# Patient Record
Sex: Female | Born: 1990 | Race: White | Hispanic: No | Marital: Married | Smoking: Never smoker
Health system: Southern US, Community
[De-identification: ages and names within clinical notes are randomized; demographics above are authoritative.]

## PROBLEM LIST (undated history)

## (undated) ENCOUNTER — Inpatient Hospital Stay (HOSPITAL_COMMUNITY): Payer: Self-pay

## (undated) DIAGNOSIS — Z789 Other specified health status: Secondary | ICD-10-CM

## (undated) DIAGNOSIS — R519 Headache, unspecified: Secondary | ICD-10-CM

## (undated) DIAGNOSIS — O139 Gestational [pregnancy-induced] hypertension without significant proteinuria, unspecified trimester: Secondary | ICD-10-CM

## (undated) DIAGNOSIS — R51 Headache: Secondary | ICD-10-CM

## (undated) HISTORY — PX: APPENDECTOMY: SHX54

---
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
## 2011-04-19 ENCOUNTER — Other Ambulatory Visit: Payer: Self-pay | Admitting: Gynecology

## 2011-08-29 ENCOUNTER — Encounter (HOSPITAL_COMMUNITY): Payer: Self-pay | Admitting: Emergency Medicine

## 2011-08-29 ENCOUNTER — Emergency Department (HOSPITAL_COMMUNITY)
Admission: EM | Admit: 2011-08-29 | Discharge: 2011-08-30 | Disposition: A | Discharge status: Home / Self Care | Payer: BC Managed Care – PPO | Attending: Emergency Medicine | Admitting: Emergency Medicine

## 2011-08-29 DIAGNOSIS — R112 Nausea with vomiting, unspecified: Secondary | ICD-10-CM

## 2011-08-29 DIAGNOSIS — O9989 Other specified diseases and conditions complicating pregnancy, childbirth and the puerperium: Secondary | ICD-10-CM | POA: Insufficient documentation

## 2011-08-29 DIAGNOSIS — K529 Noninfective gastroenteritis and colitis, unspecified: Secondary | ICD-10-CM

## 2011-08-29 DIAGNOSIS — K5289 Other specified noninfective gastroenteritis and colitis: Secondary | ICD-10-CM | POA: Insufficient documentation

## 2011-08-29 LAB — URINALYSIS, ROUTINE W REFLEX MICROSCOPIC
Ketones, ur: NEGATIVE mg/dL
Leukocytes, UA: NEGATIVE
Nitrite: NEGATIVE

## 2011-08-29 MED ORDER — ONDANSETRON HCL 4 MG/2ML IJ SOLN
4.0000 mg | Freq: Once | INTRAMUSCULAR | Status: AC
  Administered 2011-08-29: 4 mg via INTRAVENOUS
  Filled 2011-08-29: qty 2

## 2011-08-29 MED ORDER — SODIUM CHLORIDE 0.9 % IV BOLUS (SEPSIS)
2000.0000 mL | Freq: Once | INTRAVENOUS | Status: AC
  Administered 2011-08-29: 2000 mL via INTRAVENOUS

## 2011-08-29 MED ORDER — ONDANSETRON 4 MG PO TBDP: ORAL_TABLET | ORAL | Status: AC

## 2011-08-29 NOTE — ED Provider Notes (Signed)
Two days of multiple nonbloody vomiting and diarrhea spells with diffuse abdominal pain and cramps no vaginal bleeding or discharge with abdomen minimally tender diffusely without peritonitis.  I saw and evaluated the patient, reviewed the resident's note and I agree with the findings and plan.  Hurman Horn, MD 08/30/11 2237

## 2011-08-29 NOTE — Discharge Instructions (Signed)
Diet for Diarrhea, Adult Having frequent, runny stools (diarrhea) has many causes. Diarrhea may be caused or worsened by food or drink. Diarrhea may be relieved by changing your diet. IF YOU ARE NOT TOLERATING SOLID FOODS:  Drink enough water and fluids to keep your urine clear or pale yellow.   Avoid sugary drinks and sodas as well as milk-based beverages.   Avoid beverages containing caffeine and alcohol.   You may try rehydrating beverages. You can make your own by following this recipe:    tsp table salt.    tsp baking soda.   ? tsp salt substitute (potassium chloride).   1 tbs + 1 tsp sugar.   1 qt water.  As your stools become more solid, you can start eating solid foods. Add foods one at a time. If a certain food causes your diarrhea to get worse, avoid that food and try other foods. A low fiber, low-fat, and lactose-free diet is recommended. Small, frequent meals may be better tolerated.  Starches  Allowed:  White, French, and pita breads, plain rolls, buns, bagels. Plain muffins, matzo. Soda, saltine, or graham crackers. Pretzels, melba toast, zwieback. Cooked cereals made with water: cornmeal, farina, cream cereals. Dry cereals: refined corn, wheat, rice. Potatoes prepared any way without skins, refined macaroni, spaghetti, noodles, refined rice.   Avoid:  Bread, rolls, or crackers made with whole wheat, multi-grains, rye, bran seeds, nuts, or coconut. Corn tortillas or taco shells. Cereals containing whole grains, multi-grains, bran, coconut, nuts, or raisins. Cooked or dry oatmeal. Coarse wheat cereals, granola. Cereals advertised as "high-fiber." Potato skins. Whole grain pasta, wild or brown rice. Popcorn. Sweet potatoes/yams. Sweet rolls, doughnuts, waffles, pancakes, sweet breads.  Vegetables  Allowed: Strained tomato and vegetable juices. Most well-cooked and canned vegetables without seeds. Fresh: Tender lettuce, cucumber without the skin, cabbage, spinach, bean  sprouts.   Avoid: Fresh, cooked, or canned: Artichokes, baked beans, beet greens, broccoli, Brussels sprouts, corn, kale, legumes, peas, sweet potatoes. Cooked: Green or red cabbage, spinach. Avoid large servings of any vegetables, because vegetables shrink when cooked, and they contain more fiber per serving than fresh vegetables.  Fruit  Allowed: All fruit juices except prune juice. Cooked or canned: Apricots, applesauce, cantaloupe, cherries, fruit cocktail, grapefruit, grapes, kiwi, mandarin oranges, peaches, pears, plums, watermelon. Fresh: Apples without skin, ripe banana, grapes, cantaloupe, cherries, grapefruit, peaches, oranges, plums. Keep servings limited to  cup or 1 piece.   Avoid: Fresh: Apple with skin, apricots, mango, pears, raspberries, strawberries. Prune juice, stewed or dried prunes. Dried fruits, raisins, dates. Large servings of all fresh fruits.  Meat and Meat Substitutes  Allowed: Ground or well-cooked tender beef, ham, veal, lamb, pork, or poultry. Eggs, plain cheese. Fish, oysters, shrimp, lobster, other seafoods. Liver, organ meats.   Avoid: Tough, fibrous meats with gristle. Peanut butter, smooth or chunky. Cheese, nuts, seeds, legumes, dried peas, beans, lentils.  Milk  Allowed: Yogurt, lactose-free milk, kefir, drinkable yogurt, buttermilk, soy milk.   Avoid: Milk, chocolate milk, beverages made with milk, such as milk shakes.  Soups  Allowed: Bouillon, broth, or soups made from allowed foods. Any strained soup.   Avoid: Soups made from vegetables that are not allowed, cream or milk-based soups.  Desserts and Sweets  Allowed: Sugar-free gelatin, sugar-free frozen ice pops made without sugar alcohol.   Avoid: Plain cakes and cookies, pie made with allowed fruit, pudding, custard, cream pie. Gelatin, fruit, ice, sherbet, frozen ice pops. Ice cream, ice milk without nuts. Plain hard candy,   honey, jelly, molasses, syrup, sugar, chocolate syrup, gumdrops,  marshmallows.  Fats and Oils  Allowed: Avoid any fats and oils.   Avoid: Seeds, nuts, olives, avocados. Margarine, butter, cream, mayonnaise, salad oils, plain salad dressings made from allowed foods. Plain gravy, crisp bacon without rind.  Beverages  Allowed: Water, decaffeinated teas, oral rehydration solutions, sugar-free beverages.   Avoid: Fruit juices, caffeinated beverages (coffee, tea, soda or pop), alcohol, sports drinks, or lemon-lime soda or pop.  Condiments  Allowed: Ketchup, mustard, horseradish, vinegar, cream sauce, cheese sauce, cocoa powder. Spices in moderation: allspice, basil, bay leaves, celery powder or leaves, cinnamon, cumin powder, curry powder, ginger, mace, marjoram, onion or garlic powder, oregano, paprika, parsley flakes, ground pepper, rosemary, sage, savory, tarragon, thyme, turmeric.   Avoid: Coconut, honey.  Weight Monitoring: Weigh yourself every day. You should weigh yourself in the morning after you urinate and before you eat breakfast. Wear the same amount of clothing when you weigh yourself. Record your weight daily. Bring your recorded weights to your clinic visits. Tell your caregiver right away if you have gained 3 lb/1.4 kg or more in 1 day, 5 lb/2.3 kg in a week, or whatever amount you were told to report. SEEK IMMEDIATE MEDICAL CARE IF:   You are unable to keep fluids down.   You start to throw up (vomit) or diarrhea keeps coming back (persistent).   Abdominal pain develops, increases, or can be felt in one place (localizes).   You have an oral temperature above 102 F (38.9 C), not controlled by medicine.   Diarrhea contains blood or mucus.   You develop excessive weakness, dizziness, fainting, or extreme thirst.  MAKE SURE YOU:   Understand these instructions.   Will watch your condition.   Will get help right away if you are not doing well or get worse.  Document Released: 07/31/2003 Document Revised: 04/29/2011 Document Reviewed:  11/21/2008 [facility] Patient Information 2012 [place], [place].  Return for any new or worsening symptoms or any other concerns.

## 2011-08-29 NOTE — ED Provider Notes (Signed)
History     CSN: [id number]  Arrival date & time 08/29/11  2006   None     Chief Complaint  Patient presents with  . Emesis    (Consider location/radiation/quality/duration/timing/severity/associated sxs/prior treatment) HPI Cc n/v/d onset 2 days ago, associate dwith diffuse abd cramping, nonradiating, epigastrium mostly.  Sx's mild to moderate.  nonbloody emesis and stools.  No fever. History reviewed. No pertinent past medical history.  Past Surgical History  Procedure Date  . Appendectomy     No family history on file.  History  Substance Use Topics  . Smoking status: Never Smoker   . Smokeless tobacco: Not on file  . Alcohol Use: No    OB History    Grav Para Term Preterm Abortions TAB SAB Ect Mult Living   1               Review of Systems  Constitutional: Negative for fever and chills.  Gastrointestinal: Positive for nausea, vomiting, abdominal pain and diarrhea.  Genitourinary: Negative for dysuria, vaginal bleeding and vaginal discharge.  All other systems reviewed and are negative.    Allergies  Review of patient's allergies indicates no known allergies.  Home Medications   Current Outpatient Rx  Name Route Sig Dispense Refill  . ONDANSETRON 4 MG PO TBDP  4mg  ODT q4 hours prn nausea/vomit 15 tablet 0    BP 120/87  Pulse 78  Temp(Src) 98.1 F (36.7 C) (Oral)  Resp 18  SpO2 100%  LMP 07/10/2011  Physical Exam  Nursing note and vitals reviewed. Constitutional: She appears well-developed and well-nourished.  HENT:  Head: Normocephalic and atraumatic.  Eyes: Right eye exhibits no discharge. Left eye exhibits no discharge.  Neck: Normal range of motion. Neck supple.  Cardiovascular: Normal rate, regular rhythm and normal heart sounds.   Pulmonary/Chest: Effort normal and breath sounds normal.  Abdominal: Soft. She exhibits no distension and no mass. There is tenderness (mild diffuse, more in epigastrium). There is no rebound and no guarding.   Musculoskeletal: She exhibits no edema and no tenderness.  Neurological: She is alert. GCS eye subscore is 4. GCS verbal subscore is 5. GCS motor subscore is 6.  Skin: Skin is warm and dry.  Psychiatric: She has a normal mood and affect. Her behavior is normal.    ED Course  Procedures (including critical care time)   Labs Reviewed  URINALYSIS, ROUTINE W REFLEX MICROSCOPIC   No results found.   1. Gastroenteritis   2. Nausea vomiting and diarrhea       MDM  Pt is in nad, afebrile, pt slightly tachy, nontoxic appearing, exam and hx as above.  Pt here with n/v/d for 2 days and diffuse abd cramping c/w gastroenteritis.  Bedside US shows gestational sac with fluttering of fetal heart, IUP.  Pt has had no pain localized in the pelvis, no vag bleeding, doubt ectopic.  Checking UA, giving ivf's, zofran.  UA nl, pt feels better, will d/c with zofran, return warnings given      Elijio Miles, MD 08/29/11 2319

## 2011-08-29 NOTE — ED Notes (Signed)
C/o vomiting for 2 days.  Reports abd pain x 3 hours.  States she was incontinent of diarrhea x 2 within the last 3 hours.  Approx 6-[redacted] weeks pregnant.

## 2011-09-20 LAB — OB RESULTS CONSOLE GC/CHLAMYDIA: Chlamydia: NEGATIVE

## 2012-03-06 ENCOUNTER — Inpatient Hospital Stay (HOSPITAL_COMMUNITY)
Admission: AD | Admit: 2012-03-06 | Discharge: 2012-03-06 | Disposition: A | Discharge status: Home / Self Care | Payer: BC Managed Care – PPO | Source: Ambulatory Visit | Attending: Obstetrics and Gynecology | Admitting: Obstetrics and Gynecology

## 2012-03-06 ENCOUNTER — Encounter (HOSPITAL_COMMUNITY): Payer: Self-pay | Admitting: *Deleted

## 2012-03-06 DIAGNOSIS — O99891 Other specified diseases and conditions complicating pregnancy: Secondary | ICD-10-CM | POA: Insufficient documentation

## 2012-03-06 DIAGNOSIS — O139 Gestational [pregnancy-induced] hypertension without significant proteinuria, unspecified trimester: Secondary | ICD-10-CM

## 2012-03-06 DIAGNOSIS — R51 Headache: Secondary | ICD-10-CM | POA: Insufficient documentation

## 2012-03-06 DIAGNOSIS — R030 Elevated blood-pressure reading, without diagnosis of hypertension: Secondary | ICD-10-CM | POA: Insufficient documentation

## 2012-03-06 HISTORY — DX: Other specified health status: Z78.9

## 2012-03-06 LAB — URINALYSIS, ROUTINE W REFLEX MICROSCOPIC
Bilirubin Urine: NEGATIVE
Glucose, UA: NEGATIVE mg/dL
Ketones, ur: NEGATIVE mg/dL
Nitrite: NEGATIVE
pH: 6.5 (ref 5.0–8.0)

## 2012-03-06 LAB — COMPREHENSIVE METABOLIC PANEL
AST: 9 U/L (ref 0–37)
CO2: 24 mEq/L (ref 19–32)
Chloride: 102 mEq/L (ref 96–112)
Creatinine, Ser: 0.55 mg/dL (ref 0.50–1.10)
GFR calc non Af Amer: >90 mL/min (ref >90)
Total Bilirubin: 0.1 mg/dL — ABNORMAL LOW (ref 0.3–1.2)

## 2012-03-06 LAB — CBC
HCT: 36.4 % (ref 36.0–46.0)
Hemoglobin: 12 g/dL (ref 12.0–15.0)
MCV: 86.3 fL (ref 78.0–100.0)
Platelets: 250 10*3/uL (ref 150–400)
RBC: 4.22 MIL/uL (ref 3.87–5.11)
WBC: 12.8 10*3/uL — ABNORMAL HIGH (ref 4.0–10.5)

## 2012-03-06 LAB — LACTATE DEHYDROGENASE: LDH: 148 U/L (ref 94–250)

## 2012-03-06 LAB — URINE MICROSCOPIC-ADD ON

## 2012-03-06 NOTE — MAU Note (Signed)
Sent from MD office for [name] eval, has HA, denies blurry vision, swelling in feet & hands.

## 2012-03-06 NOTE — MAU Note (Signed)
Pt states she has been having scant amount of clear fluid discharge x 1 week.

## 2012-03-06 NOTE — Progress Notes (Signed)
Pt states she always thinks she has wet herself

## 2012-03-06 NOTE — MAU Provider Note (Signed)
History     CSN: [id number]  Arrival date and time: 03/06/12 1519   None     Chief Complaint  Patient presents with  . Hypertension   HPI 21 y.o. G1P0 at [redacted]w[redacted]d sent from office for PIH eval. Denies headache, vision changes, abd pain. + fetal movement.    Past Medical History  Diagnosis Date  . No pertinent past medical history     Past Surgical History  Procedure Date  . Appendectomy     Family History  Problem Relation Age of Onset  . Diabetes Mother   . Hypertension Mother   . Hypertension Father   . Heart disease Father   . Diabetes Maternal Grandmother   . Diabetes Maternal Grandfather   . Hypertension Paternal Grandfather     History  Substance Use Topics  . Smoking status: Never Smoker   . Smokeless tobacco: Not on file  . Alcohol Use: No    Allergies: No Known Allergies  No prescriptions prior to admission    Review of Systems  Constitutional: Negative.   Respiratory: Negative.   Cardiovascular: Negative.   Gastrointestinal: Negative for nausea, vomiting, abdominal pain, diarrhea and constipation.  Genitourinary: Negative for dysuria, urgency, frequency, hematuria and flank pain.       Negative for vaginal bleeding, cramping/contractions  Musculoskeletal: Negative.   Neurological: Negative.   Psychiatric/Behavioral: Negative.    Physical Exam   Blood pressure 128/92, pulse 115, temperature 98.1 F (36.7 C), temperature source Oral, resp. rate 18, height 5' (1.524 m), weight 198 lb 6.4 oz (89.994 kg), last menstrual period 07/10/2011, SpO2 100.00%.  Filed Vitals:   03/06/12 1600 03/06/12 1615 03/06/12 1630 03/06/12 1700  BP: 128/92 136/87 129/69 130/88  Pulse: 115 103 113 108  Temp:      TempSrc:      Resp:      Height:      Weight:      SpO2:         Physical Exam  Nursing note and vitals reviewed. Constitutional: She is oriented to person, place, and time. She appears well-developed and well-nourished. No distress.    Cardiovascular: Normal rate.   Respiratory: Effort normal.  GI: Soft. There is no tenderness.  Musculoskeletal: Normal range of motion.  Neurological: She is alert and oriented to person, place, and time. She has normal reflexes.  Skin: Skin is warm and dry.  Psychiatric: She has a normal mood and affect.   EFM reactive MAU Course  Procedures Results for orders placed during the hospital encounter of 03/06/12 (from the past 72 hour(s))  CBC     Status: Abnormal   Collection Time   03/06/12  3:45 PM      Component Value Range Comment   WBC 12.8 (*) 4.0 - 10.5 K/uL    RBC 4.22  3.87 - 5.11 MIL/uL    Hemoglobin 12.0  12.0 - 15.0 g/dL    HCT 11.9  14.7 - 82.9 %    MCV 86.3  78.0 - 100.0 fL    MCH 28.4  26.0 - 34.0 pg    MCHC 33.0  30.0 - 36.0 g/dL    RDW 56.2  13.0 - 86.5 %    Platelets 250  150 - 400 K/uL   COMPREHENSIVE METABOLIC PANEL     Status: Abnormal   Collection Time   03/06/12  3:45 PM      Component Value Range Comment   Sodium 137  135 - 145 mEq/L  Potassium 3.8  3.5 - 5.1 mEq/L    Chloride 102  96 - 112 mEq/L    CO2 24  19 - 32 mEq/L    Glucose, Bld 93  70 - 99 mg/dL    BUN 7  6 - 23 mg/dL    Creatinine, Ser 2.95  0.50 - 1.10 mg/dL    Calcium 9.1  8.4 - 62.1 mg/dL    Total Protein 6.3  6.0 - 8.3 g/dL    Albumin 2.6 (*) 3.5 - 5.2 g/dL    AST 9  0 - 37 U/L    ALT 8  0 - 35 U/L    Alkaline Phosphatase 110  39 - 117 U/L    Total Bilirubin 0.1 (*) 0.3 - 1.2 mg/dL    GFR calc non Af Amer >90  >90 mL/min    GFR calc Af Amer >90  >90 mL/min   URIC ACID     Status: Normal   Collection Time   03/06/12  3:45 PM      Component Value Range Comment   Uric Acid, Serum 4.8  2.4 - 7.0 mg/dL   LACTATE DEHYDROGENASE     Status: Normal   Collection Time   03/06/12  3:45 PM      Component Value Range Comment   LDH 148  94 - 250 U/L   URINALYSIS, ROUTINE W REFLEX MICROSCOPIC     Status: Abnormal   Collection Time   03/06/12  4:09 PM      Component Value Range Comment    Color, Urine YELLOW  YELLOW    APPearance HAZY (*) CLEAR    Specific Gravity, Urine >1.030 (*) 1.005 - 1.030    pH 6.5  5.0 - 8.0    Glucose, UA NEGATIVE  NEGATIVE mg/dL    Hgb urine dipstick NEGATIVE  NEGATIVE    Bilirubin Urine NEGATIVE  NEGATIVE    Ketones, ur NEGATIVE  NEGATIVE mg/dL    Protein, ur NEGATIVE  NEGATIVE mg/dL    Urobilinogen, UA 0.2  0.0 - 1.0 mg/dL    Nitrite NEGATIVE  NEGATIVE    Leukocytes, UA TRACE (*) NEGATIVE   URINE MICROSCOPIC-ADD ON     Status: Abnormal   Collection Time   03/06/12  4:09 PM      Component Value Range Comment   Squamous Epithelial / LPF MANY (*) RARE    WBC, UA 7-10  <3 WBC/hpf    Bacteria, UA MANY (*) RARE      Assessment and Plan   1. PIH (pregnancy induced hypertension)       Medication List     As of 03/08/2012  3:41 PM    CONTINUE taking these medications         acetaminophen 325 MG tablet   Commonly known as: TYLENOL      prenatal multivitamin Tabs            Follow-up Information    Follow up with TOMBLIN II,JAMES E, MD. Schedule an appointment as soon as possible for a visit in 2 days. (for blood pressure check)    Contact information:   [address] [place] [address] [phone]           [name] 03/06/2012, 4:26 PM

## 2012-03-06 NOTE — MAU Note (Signed)
Pt state she was seen in the office today they sent her over for further evaluation

## 2012-03-17 ENCOUNTER — Encounter (HOSPITAL_COMMUNITY): Payer: Self-pay

## 2012-03-17 ENCOUNTER — Inpatient Hospital Stay (HOSPITAL_COMMUNITY)
Admission: AD | Admit: 2012-03-17 | Discharge: 2012-03-17 | Disposition: A | Discharge status: Home / Self Care | Payer: BC Managed Care – PPO | Source: Ambulatory Visit | Attending: Obstetrics and Gynecology | Admitting: Obstetrics and Gynecology

## 2012-03-17 ENCOUNTER — Inpatient Hospital Stay (HOSPITAL_COMMUNITY): Payer: BC Managed Care – PPO

## 2012-03-17 DIAGNOSIS — O99891 Other specified diseases and conditions complicating pregnancy: Secondary | ICD-10-CM | POA: Insufficient documentation

## 2012-03-17 DIAGNOSIS — R51 Headache: Secondary | ICD-10-CM | POA: Insufficient documentation

## 2012-03-17 DIAGNOSIS — R030 Elevated blood-pressure reading, without diagnosis of hypertension: Secondary | ICD-10-CM | POA: Insufficient documentation

## 2012-03-17 LAB — COMPREHENSIVE METABOLIC PANEL
Alkaline Phosphatase: 120 U/L — ABNORMAL HIGH (ref 39–117)
BUN: 9 mg/dL (ref 6–23)
CO2: 25 mEq/L (ref 19–32)
Chloride: 101 mEq/L (ref 96–112)
GFR calc Af Amer: >90 mL/min (ref >90)
Glucose, Bld: 72 mg/dL (ref 70–99)
Potassium: 4.5 mEq/L (ref 3.5–5.1)
Total Bilirubin: 0.2 mg/dL — ABNORMAL LOW (ref 0.3–1.2)

## 2012-03-17 LAB — CBC
HCT: 38.6 % (ref 36.0–46.0)
Hemoglobin: 12.7 g/dL (ref 12.0–15.0)
RBC: 4.43 MIL/uL (ref 3.87–5.11)
WBC: 12.9 10*3/uL — ABNORMAL HIGH (ref 4.0–10.5)

## 2012-03-17 LAB — LACTATE DEHYDROGENASE: LDH: 156 U/L (ref 94–250)

## 2012-03-17 NOTE — MAU Note (Signed)
Patient states she was seen in the office this am and sent to MAU for evaluation of elevated blood pressure and non reactive NST. Patient states she has a slight headache that started when she was informed to come to MAU. Denies any contractions, bleeding or leaking and reports less fetal movement than usual for about one week.

## 2012-03-17 NOTE — Progress Notes (Signed)
Lab at bedside for blood draw.

## 2012-03-17 NOTE — Progress Notes (Signed)
To US for BPP via WC.

## 2012-03-20 ENCOUNTER — Inpatient Hospital Stay (HOSPITAL_COMMUNITY)
Admission: AD | Admit: 2012-03-20 | Discharge: 2012-03-20 | Disposition: A | Discharge status: Home / Self Care | Payer: BC Managed Care – PPO | Source: Ambulatory Visit | Attending: Obstetrics and Gynecology | Admitting: Obstetrics and Gynecology

## 2012-03-20 ENCOUNTER — Encounter (HOSPITAL_COMMUNITY): Payer: Self-pay | Admitting: *Deleted

## 2012-03-20 DIAGNOSIS — O99891 Other specified diseases and conditions complicating pregnancy: Secondary | ICD-10-CM | POA: Insufficient documentation

## 2012-03-20 DIAGNOSIS — O139 Gestational [pregnancy-induced] hypertension without significant proteinuria, unspecified trimester: Secondary | ICD-10-CM

## 2012-03-20 DIAGNOSIS — R030 Elevated blood-pressure reading, without diagnosis of hypertension: Secondary | ICD-10-CM | POA: Insufficient documentation

## 2012-03-20 LAB — COMPREHENSIVE METABOLIC PANEL
ALT: 8 U/L (ref 0–35)
CO2: 25 mEq/L (ref 19–32)
Calcium: 9.3 mg/dL (ref 8.4–10.5)
Chloride: 103 mEq/L (ref 96–112)
Creatinine, Ser: 0.64 mg/dL (ref 0.50–1.10)
GFR calc Af Amer: >90 mL/min (ref >90)
GFR calc non Af Amer: >90 mL/min (ref >90)
Glucose, Bld: 80 mg/dL (ref 70–99)
Sodium: 137 mEq/L (ref 135–145)
Total Bilirubin: 0.1 mg/dL — ABNORMAL LOW (ref 0.3–1.2)

## 2012-03-20 LAB — CBC
Hemoglobin: 11.8 g/dL — ABNORMAL LOW (ref 12.0–15.0)
MCH: 28.6 pg (ref 26.0–34.0)
MCHC: 33 g/dL (ref 30.0–36.0)
Platelets: 221 10*3/uL (ref 150–400)
RDW: 14 % (ref 11.5–15.5)

## 2012-03-20 LAB — URINALYSIS, DIPSTICK ONLY
Bilirubin Urine: NEGATIVE
Ketones, ur: NEGATIVE mg/dL
Leukocytes, UA: NEGATIVE
Nitrite: NEGATIVE
Protein, ur: NEGATIVE mg/dL

## 2012-03-20 NOTE — MAU Note (Signed)
Patient states she was sent from the office for evaluation of elevated blood pressure. Was seen on 10-25 in MAU. States she has been having a headache. Denies contractions, bleeding or leaking and reports good fetal movement.

## 2012-03-20 NOTE — MAU Provider Note (Signed)
History     CSN: [id number]  Arrival date and time: 03/20/12 1621   None     Chief Complaint  Patient presents with  . Hypertension  . Headache   HPI Pt is a 21 y.o. G1P0 at [redacted]w[redacted]d with high blood pressure. Sent over from office today with BP 162/108. Has been high for last 2 weeks. Headache last 2 days. No vision change, no RUQ pain. No ctx, no VB, no lof. Baby moving well. Has not done 24 hour urine protein. Does not know if she had protein in her urine today.  Review of prenatal record shows BP of 146/100 on 10/14 but negative proteinuria. No other records available.   OB History    Grav Para Term Preterm Abortions TAB SAB Ect Mult Living   1               Past Medical History  Diagnosis Date  . No pertinent past medical history     Past Surgical History  Procedure Date  . Appendectomy     Family History  Problem Relation Age of Onset  . Diabetes Mother   . Hypertension Mother   . Hypertension Father   . Heart disease Father   . Diabetes Maternal Grandmother   . Diabetes Maternal Grandfather   . Hypertension Paternal Grandfather     History  Substance Use Topics  . Smoking status: Never Smoker   . Smokeless tobacco: Not on file  . Alcohol Use: No    Allergies: No Known Allergies  Prescriptions prior to admission  Medication Sig Dispense Refill  . Prenatal Vit-Fe Fumarate-FA (PRENATAL MULTIVITAMIN) TABS Take 1 tablet by mouth daily.        Review of Systems  Constitutional: Negative for fever and chills.  HENT: Negative for tinnitus.   Eyes: Negative for blurred vision and double vision.  Respiratory: Negative for cough and shortness of breath.   Cardiovascular: Negative for chest pain and palpitations.  Gastrointestinal: Negative for nausea, vomiting and abdominal pain.  Genitourinary: Negative for dysuria, urgency and frequency.  Musculoskeletal: Negative for back pain.  Neurological: Positive for headaches. Negative for dizziness.    Physical Exam   Blood pressure 148/98, pulse 89, temperature 99.5 F (37.5 C), temperature source Oral, resp. rate 16, weight 91.354 kg (201 lb 6.4 oz), last menstrual period 07/10/2011, SpO2 100.00%.  Filed Vitals:   03/20/12 1648 03/20/12 1704  BP: 140/92 148/98  Pulse: 89   Temp: 99.5 F (37.5 C)   TempSrc: Oral   Resp: 16   Weight: 91.354 kg (201 lb 6.4 oz)   SpO2: 100%     Physical Exam  Constitutional: She is oriented to person, place, and time. She appears well-developed and well-nourished. No distress.  HENT:  Head: Normocephalic and atraumatic.  Eyes: Conjunctivae normal and EOM are normal.  Neck: Normal range of motion. Neck supple.  Cardiovascular: Normal rate, regular rhythm and normal heart sounds.   Respiratory: Effort normal and breath sounds normal. No respiratory distress. She has no wheezes.  GI: There is no tenderness. There is no rebound and no guarding.  Musculoskeletal: Normal range of motion. She exhibits edema. She exhibits no tenderness.  Neurological: She is alert and oriented to person, place, and time.  Skin: Skin is warm and dry.  Psychiatric: She has a normal mood and affect.   FHTs:  135, mod variability, accels present, no decels Toco:  Irritability  Results for orders placed during the hospital encounter of 03/20/12 (from  the past 24 hour(s))  URINALYSIS, DIPSTICK ONLY     Status: Normal   Collection Time   03/20/12  4:55 PM      Component Value Range   Specific Gravity, Urine 1.025  1.005 - 1.030   pH 7.0  5.0 - 8.0   Glucose, UA NEGATIVE  NEGATIVE mg/dL   Hgb urine dipstick NEGATIVE  NEGATIVE   Bilirubin Urine NEGATIVE  NEGATIVE   Ketones, ur NEGATIVE  NEGATIVE mg/dL   Protein, ur NEGATIVE  NEGATIVE mg/dL   Urobilinogen, UA 0.2  0.0 - 1.0 mg/dL   Nitrite NEGATIVE  NEGATIVE   Leukocytes, UA NEGATIVE  NEGATIVE  COMPREHENSIVE METABOLIC PANEL     Status: Abnormal   Collection Time   03/20/12  5:15 PM      Component Value Range    Sodium 137  135 - 145 mEq/L   Potassium 4.1  3.5 - 5.1 mEq/L   Chloride 103  96 - 112 mEq/L   CO2 25  19 - 32 mEq/L   Glucose, Bld 80  70 - 99 mg/dL   BUN 9  6 - 23 mg/dL   Creatinine, Ser 1.61  0.50 - 1.10 mg/dL   Calcium 9.3  8.4 - 09.6 mg/dL   Total Protein 5.8 (*) 6.0 - 8.3 g/dL   Albumin 2.5 (*) 3.5 - 5.2 g/dL   AST 9  0 - 37 U/L   ALT 8  0 - 35 U/L   Alkaline Phosphatase 119 (*) 39 - 117 U/L   Total Bilirubin 0.1 (*) 0.3 - 1.2 mg/dL   GFR calc non Af Amer >90  >90 mL/min   GFR calc Af Amer >90  >90 mL/min  CBC     Status: Abnormal   Collection Time   03/20/12  5:15 PM      Component Value Range   WBC 11.5 (*) 4.0 - 10.5 K/uL   RBC 4.13  3.87 - 5.11 MIL/uL   Hemoglobin 11.8 (*) 12.0 - 15.0 g/dL   HCT 04.5 (*) 40.9 - 81.1 %   MCV 86.7  78.0 - 100.0 fL   MCH 28.6  26.0 - 34.0 pg   MCHC 33.0  30.0 - 36.0 g/dL   RDW 91.4  78.2 - 95.6 %   Platelets 221  150 - 400 K/uL     MAU Course  Procedures   Assessment and Plan  21 y.o. G1P0 at [redacted]w[redacted]d with elevated BP in pregnancy - No proteinuria - Labs normal - Discussed with Dr. Arelia Sneddon. F/U at office tomorrow for BP and to set up induction.  Napoleon Form, MD   Napoleon Form 03/20/2012, 5:08 PM

## 2012-03-24 ENCOUNTER — Encounter (HOSPITAL_COMMUNITY): Payer: Self-pay | Admitting: *Deleted

## 2012-03-24 ENCOUNTER — Inpatient Hospital Stay (HOSPITAL_COMMUNITY)
Admission: AD | Admit: 2012-03-24 | Discharge: 2012-03-28 | DRG: 651 | Disposition: A | Discharge status: Home / Self Care | Payer: BC Managed Care – PPO | Source: Ambulatory Visit | Attending: Obstetrics and Gynecology | Admitting: Obstetrics and Gynecology

## 2012-03-24 DIAGNOSIS — O4590 Premature separation of placenta, unspecified, unspecified trimester: Secondary | ICD-10-CM | POA: Diagnosis present

## 2012-03-24 DIAGNOSIS — O1414 Severe pre-eclampsia complicating childbirth: Principal | ICD-10-CM | POA: Diagnosis present

## 2012-03-24 LAB — COMPREHENSIVE METABOLIC PANEL
ALT: 8 U/L (ref 0–35)
AST: 10 U/L (ref 0–37)
Albumin: 2.4 g/dL — ABNORMAL LOW (ref 3.5–5.2)
Alkaline Phosphatase: 118 U/L — ABNORMAL HIGH (ref 39–117)
CO2: 24 mEq/L (ref 19–32)
Chloride: 102 mEq/L (ref 96–112)
Creatinine, Ser: 0.67 mg/dL (ref 0.50–1.10)
GFR calc non Af Amer: >90 mL/min (ref >90)
Potassium: 3.8 mEq/L (ref 3.5–5.1)
Sodium: 136 mEq/L (ref 135–145)
Total Bilirubin: 0.1 mg/dL — ABNORMAL LOW (ref 0.3–1.2)

## 2012-03-24 LAB — CBC
Hemoglobin: 11.9 g/dL — ABNORMAL LOW (ref 12.0–15.0)
MCHC: 33.6 g/dL (ref 30.0–36.0)
Platelets: 214 10*3/uL (ref 150–400)
RDW: 14.1 % (ref 11.5–15.5)

## 2012-03-24 LAB — TYPE AND SCREEN
ABO/RH(D): O POS
Antibody Screen: NEGATIVE

## 2012-03-24 LAB — OB RESULTS CONSOLE HEPATITIS B SURFACE ANTIGEN: Hepatitis B Surface Ag: NEGATIVE

## 2012-03-24 LAB — OB RESULTS CONSOLE GBS: GBS: NEGATIVE

## 2012-03-24 LAB — OB RESULTS CONSOLE RPR: RPR: NONREACTIVE

## 2012-03-24 MED ORDER — LACTATED RINGERS IV SOLN: 500.0000 mL | Freq: Once | INTRAVENOUS | Status: DC

## 2012-03-24 MED ORDER — FENTANYL 2.5 MCG/ML BUPIVACAINE 1/10 % EPIDURAL INFUSION (WH - ANES): 14.0000 mL/h | INTRAMUSCULAR | Status: DC

## 2012-03-24 MED ORDER — LIDOCAINE HCL (PF) 1 % IJ SOLN: 30.0000 mL | INTRAMUSCULAR | Status: DC | PRN

## 2012-03-24 MED ORDER — OXYTOCIN BOLUS FROM INFUSION: 500.0000 mL | INTRAVENOUS | Status: DC

## 2012-03-24 MED ORDER — ACETAMINOPHEN 325 MG PO TABS: 650.0000 mg | ORAL_TABLET | ORAL | Status: DC | PRN

## 2012-03-24 MED ORDER — PHENYLEPHRINE 40 MCG/ML (10ML) SYRINGE FOR IV PUSH (FOR BLOOD PRESSURE SUPPORT): 80.0000 ug | PREFILLED_SYRINGE | INTRAVENOUS | Status: DC | PRN

## 2012-03-24 MED ORDER — EPHEDRINE 5 MG/ML INJ: 10.0000 mg | INTRAVENOUS | Status: DC | PRN

## 2012-03-24 MED ORDER — ZOLPIDEM TARTRATE 5 MG PO TABS: 5.0000 mg | ORAL_TABLET | Freq: Every evening | ORAL | Status: DC | PRN

## 2012-03-24 MED ORDER — BUTORPHANOL TARTRATE 1 MG/ML IJ SOLN: 1.0000 mg | INTRAMUSCULAR | Status: DC | PRN

## 2012-03-24 MED ORDER — TERBUTALINE SULFATE 1 MG/ML IJ SOLN: 0.2500 mg | Freq: Once | INTRAMUSCULAR | Status: AC | PRN

## 2012-03-24 MED ORDER — CITRIC ACID-SODIUM CITRATE 334-500 MG/5ML PO SOLN
30.0000 mL | ORAL | Status: DC | PRN
  Administered 2012-03-25: 30 mL via ORAL
  Filled 2012-03-24: qty 15

## 2012-03-24 MED ORDER — MISOPROSTOL 25 MCG QUARTER TABLET
25.0000 ug | ORAL_TABLET | ORAL | Status: DC | PRN
  Administered 2012-03-24 (×2): 25 ug via VAGINAL
  Filled 2012-03-24 (×2): qty 0.25

## 2012-03-24 MED ORDER — LACTATED RINGERS IV SOLN
INTRAVENOUS | Status: DC
  Administered 2012-03-24: 13:00:00 via INTRAVENOUS

## 2012-03-24 MED ORDER — ONDANSETRON HCL 4 MG/2ML IJ SOLN: 4.0000 mg | Freq: Four times a day (QID) | INTRAMUSCULAR | Status: DC | PRN

## 2012-03-24 MED ORDER — OXYTOCIN 40 UNITS IN LACTATED RINGERS INFUSION - SIMPLE MED: 62.5000 mL/h | INTRAVENOUS | Status: DC

## 2012-03-24 MED ORDER — DIPHENHYDRAMINE HCL 50 MG/ML IJ SOLN: 12.5000 mg | INTRAMUSCULAR | Status: DC | PRN

## 2012-03-24 MED ORDER — OXYCODONE-ACETAMINOPHEN 5-325 MG PO TABS: 1.0000 | ORAL_TABLET | ORAL | Status: DC | PRN

## 2012-03-24 MED ORDER — LACTATED RINGERS IV SOLN
500.0000 mL | INTRAVENOUS | Status: DC | PRN
  Administered 2012-03-24: 300 mL via INTRAVENOUS

## 2012-03-24 MED ORDER — IBUPROFEN 600 MG PO TABS: 600.0000 mg | ORAL_TABLET | Freq: Four times a day (QID) | ORAL | Status: DC | PRN

## 2012-03-24 MED ORDER — FLEET ENEMA 7-19 GM/118ML RE ENEM: 1.0000 | ENEMA | RECTAL | Status: DC | PRN

## 2012-03-24 NOTE — H&P (Signed)
[facility]ne [name] is a 21 y.o. female presenting for induction of labor. BP increasingly labile from 160/108 to 136/96. HA today, some blurry vision on/off. Also C/O epigastric pain today. No ROM or bleeding. Maternal Medical History:  Prenatal complications: Pre-eclampsia.     OB History    Grav Para Term Preterm Abortions TAB SAB Ect Mult Living   1              Past Medical History  Diagnosis Date  . No pertinent past medical history    Past Surgical History  Procedure Date  . Appendectomy    Family History: family history includes Diabetes in her maternal grandfather, maternal grandmother, and mother; Heart disease in her father; and Hypertension in her father, mother, and paternal grandfather. Social History:  reports that she has never smoked. She does not have any smokeless tobacco history on file. She reports that she does not drink alcohol or use illicit drugs.   Prenatal Transfer Tool  Maternal Diabetes: No Genetic Screening: Normal Maternal Ultrasounds/Referrals: Normal Fetal Ultrasounds or other Referrals:  None Maternal Substance Abuse:  No Significant Maternal Medications:  None Significant Maternal Lab Results:  None Other Comments:  None  Review of Systems  Eyes: Positive for blurred vision.  Gastrointestinal: Positive for abdominal pain.  Neurological: Positive for headaches.      Last menstrual period 07/10/2011. Maternal Exam:  Abdomen: Patient reports the following abdominal tenderness: epigastric.  Fetal presentation: vertex     Physical Exam  Cardiovascular: Normal rate and regular rhythm.   Respiratory: Effort normal and breath sounds normal.  GI: There is tenderness in the epigastric area.  Neurological:       DTR 3+   Cx 1/thick/high, U/S in office confirms vtx +periorbital edema Prenatal labs: ABO, Rh: O/Positive/-- (11/01 0000) Antibody:   Rubella: Immune (11/01 0000) RPR: Nonreactive (11/01 0000)  HBsAg: Negative (11/01 0000)  HIV:  Non-reactive (11/01 0000)  GBS: Negative (11/01 0000)   Assessment/Plan: 21 yo G1P0 with severe preeclampsia on basis of HA/vision change/epigastric pain with hypertension. D/W patient / husband two stage induction of labor with risks/benefits reviewed reviewed.   Rayce Brahmbhatt II,Maurie Musco E 03/24/2012, 1:16 PM

## 2012-03-25 ENCOUNTER — Encounter (HOSPITAL_COMMUNITY): Payer: Self-pay | Admitting: Anesthesiology

## 2012-03-25 ENCOUNTER — Inpatient Hospital Stay (HOSPITAL_COMMUNITY): Payer: BC Managed Care – PPO | Admitting: Anesthesiology

## 2012-03-25 ENCOUNTER — Encounter (HOSPITAL_COMMUNITY): Payer: Self-pay | Admitting: *Deleted

## 2012-03-25 ENCOUNTER — Encounter (HOSPITAL_COMMUNITY)
Admission: AD | Disposition: A | Discharge status: Home / Self Care | Payer: Self-pay | Source: Ambulatory Visit | Attending: Obstetrics and Gynecology

## 2012-03-25 LAB — COMPREHENSIVE METABOLIC PANEL
ALT: 7 U/L (ref 0–35)
AST: 10 U/L (ref 0–37)
Albumin: 2 g/dL — ABNORMAL LOW (ref 3.5–5.2)
Alkaline Phosphatase: 105 U/L (ref 39–117)
CO2: 23 mEq/L (ref 19–32)
Chloride: 102 mEq/L (ref 96–112)
Creatinine, Ser: 0.64 mg/dL (ref 0.50–1.10)
GFR calc Af Amer: >90 mL/min (ref >90)
Potassium: 3.5 mEq/L (ref 3.5–5.1)
Sodium: 135 mEq/L (ref 135–145)

## 2012-03-25 LAB — CBC
Hemoglobin: 11.1 g/dL — ABNORMAL LOW (ref 12.0–15.0)
MCH: 28.7 pg (ref 26.0–34.0)
MCV: 87.1 fL (ref 78.0–100.0)
RBC: 3.87 MIL/uL (ref 3.87–5.11)
WBC: 16 10*3/uL — ABNORMAL HIGH (ref 4.0–10.5)

## 2012-03-25 SURGERY — Surgical Case
Anesthesia: Spinal | Site: Abdomen | Wound class: Clean Contaminated

## 2012-03-25 MED ORDER — 0.9 % SODIUM CHLORIDE (POUR BTL) OPTIME
TOPICAL | Status: DC | PRN
  Administered 2012-03-25: 1000 mL

## 2012-03-25 MED ORDER — KETOROLAC TROMETHAMINE 30 MG/ML IJ SOLN: 30.0000 mg | Freq: Four times a day (QID) | INTRAMUSCULAR | Status: AC | PRN

## 2012-03-25 MED ORDER — HYDROMORPHONE HCL PF 1 MG/ML IJ SOLN
0.2500 mg | INTRAMUSCULAR | Status: DC | PRN
  Administered 2012-03-25: 0.5 mg via INTRAVENOUS

## 2012-03-25 MED ORDER — MORPHINE SULFATE (PF) 0.5 MG/ML IJ SOLN
INTRAMUSCULAR | Status: DC | PRN
  Administered 2012-03-25: 4.8 mg via INTRAVENOUS

## 2012-03-25 MED ORDER — MORPHINE SULFATE 0.5 MG/ML IJ SOLN
INTRAMUSCULAR | Status: AC
  Filled 2012-03-25: qty 10

## 2012-03-25 MED ORDER — OXYTOCIN 40 UNITS IN LACTATED RINGERS INFUSION - SIMPLE MED: 62.5000 mL/h | INTRAVENOUS | Status: AC

## 2012-03-25 MED ORDER — KETOROLAC TROMETHAMINE 60 MG/2ML IM SOLN
INTRAMUSCULAR | Status: AC
  Filled 2012-03-25: qty 2

## 2012-03-25 MED ORDER — ONDANSETRON HCL 4 MG/2ML IJ SOLN: 4.0000 mg | Freq: Three times a day (TID) | INTRAMUSCULAR | Status: DC | PRN

## 2012-03-25 MED ORDER — SIMETHICONE 80 MG PO CHEW
80.0000 mg | CHEWABLE_TABLET | ORAL | Status: DC | PRN
  Administered 2012-03-26 (×3): 80 mg via ORAL

## 2012-03-25 MED ORDER — ONDANSETRON HCL 4 MG/2ML IJ SOLN
INTRAMUSCULAR | Status: DC | PRN
  Administered 2012-03-25: 4 mg via INTRAVENOUS

## 2012-03-25 MED ORDER — OXYTOCIN 10 UNIT/ML IJ SOLN
INTRAMUSCULAR | Status: AC
  Filled 2012-03-25: qty 4

## 2012-03-25 MED ORDER — DIBUCAINE 1 % RE OINT: 1.0000 "application " | TOPICAL_OINTMENT | RECTAL | Status: DC | PRN

## 2012-03-25 MED ORDER — LACTATED RINGERS IV SOLN
INTRAVENOUS | Status: DC | PRN
  Administered 2012-03-25: 02:00:00 via INTRAVENOUS

## 2012-03-25 MED ORDER — NALBUPHINE SYRINGE 5 MG/0.5 ML
5.0000 mg | INJECTION | INTRAMUSCULAR | Status: DC | PRN
  Filled 2012-03-25: qty 1

## 2012-03-25 MED ORDER — MAGNESIUM SULFATE BOLUS VIA INFUSION
4.0000 g | Freq: Once | INTRAVENOUS | Status: AC
  Administered 2012-03-25: 4 g via INTRAVENOUS
  Filled 2012-03-25: qty 500

## 2012-03-25 MED ORDER — DIPHENHYDRAMINE HCL 50 MG/ML IJ SOLN: 25.0000 mg | INTRAMUSCULAR | Status: DC | PRN

## 2012-03-25 MED ORDER — CEFAZOLIN SODIUM-DEXTROSE 2-3 GM-% IV SOLR
INTRAVENOUS | Status: AC
  Filled 2012-03-25: qty 50

## 2012-03-25 MED ORDER — SIMETHICONE 80 MG PO CHEW
80.0000 mg | CHEWABLE_TABLET | Freq: Three times a day (TID) | ORAL | Status: DC
  Administered 2012-03-25 – 2012-03-28 (×12): 80 mg via ORAL

## 2012-03-25 MED ORDER — ONDANSETRON HCL 4 MG/2ML IJ SOLN
INTRAMUSCULAR | Status: AC
  Filled 2012-03-25: qty 2

## 2012-03-25 MED ORDER — OXYCODONE-ACETAMINOPHEN 5-325 MG PO TABS
1.0000 | ORAL_TABLET | ORAL | Status: DC | PRN
  Administered 2012-03-25 – 2012-03-26 (×2): 2 via ORAL
  Administered 2012-03-26: 1 via ORAL
  Administered 2012-03-26 – 2012-03-27 (×3): 2 via ORAL
  Administered 2012-03-27: 1 via ORAL
  Administered 2012-03-27 – 2012-03-28 (×2): 2 via ORAL
  Filled 2012-03-25 (×2): qty 1
  Filled 2012-03-25: qty 2
  Filled 2012-03-25: qty 1
  Filled 2012-03-25 (×4): qty 2
  Filled 2012-03-25 (×2): qty 1

## 2012-03-25 MED ORDER — FENTANYL CITRATE 0.05 MG/ML IJ SOLN
INTRAMUSCULAR | Status: AC
  Filled 2012-03-25: qty 2

## 2012-03-25 MED ORDER — NALBUPHINE HCL 10 MG/ML IJ SOLN: 5.0000 mg | INTRAMUSCULAR | Status: DC | PRN

## 2012-03-25 MED ORDER — KETOROLAC TROMETHAMINE 60 MG/2ML IM SOLN: 60.0000 mg | Freq: Once | INTRAMUSCULAR | Status: AC | PRN

## 2012-03-25 MED ORDER — ONDANSETRON HCL 4 MG PO TABS: 4.0000 mg | ORAL_TABLET | ORAL | Status: DC | PRN

## 2012-03-25 MED ORDER — PRENATAL MULTIVITAMIN CH
1.0000 | ORAL_TABLET | Freq: Every day | ORAL | Status: DC
  Administered 2012-03-25 – 2012-03-28 (×4): 1 via ORAL
  Filled 2012-03-25 (×4): qty 1

## 2012-03-25 MED ORDER — MORPHINE SULFATE (PF) 0.5 MG/ML IJ SOLN
INTRAMUSCULAR | Status: DC | PRN
  Administered 2012-03-25: .2 mg via INTRATHECAL

## 2012-03-25 MED ORDER — ZOLPIDEM TARTRATE 5 MG PO TABS: 5.0000 mg | ORAL_TABLET | Freq: Every evening | ORAL | Status: DC | PRN

## 2012-03-25 MED ORDER — DIPHENHYDRAMINE HCL 25 MG PO CAPS: 25.0000 mg | ORAL_CAPSULE | ORAL | Status: DC | PRN

## 2012-03-25 MED ORDER — DIPHENHYDRAMINE HCL 25 MG PO CAPS: 25.0000 mg | ORAL_CAPSULE | Freq: Four times a day (QID) | ORAL | Status: DC | PRN

## 2012-03-25 MED ORDER — HYDROMORPHONE HCL PF 1 MG/ML IJ SOLN
INTRAMUSCULAR | Status: AC
  Administered 2012-03-25: 0.5 mg via INTRAVENOUS
  Filled 2012-03-25: qty 1

## 2012-03-25 MED ORDER — ONDANSETRON HCL 4 MG/2ML IJ SOLN: 4.0000 mg | INTRAMUSCULAR | Status: DC | PRN

## 2012-03-25 MED ORDER — LANOLIN HYDROUS EX OINT: 1.0000 "application " | TOPICAL_OINTMENT | CUTANEOUS | Status: DC | PRN

## 2012-03-25 MED ORDER — NALBUPHINE HCL 10 MG/ML IJ SOLN
5.0000 mg | INTRAMUSCULAR | Status: DC | PRN
  Administered 2012-03-25: 10 mg via SUBCUTANEOUS

## 2012-03-25 MED ORDER — MEPERIDINE HCL 25 MG/ML IJ SOLN: 6.2500 mg | INTRAMUSCULAR | Status: DC | PRN

## 2012-03-25 MED ORDER — CEFAZOLIN SODIUM-DEXTROSE 2-3 GM-% IV SOLR
INTRAVENOUS | Status: DC | PRN
  Administered 2012-03-25: 2 g via INTRAVENOUS

## 2012-03-25 MED ORDER — OXYTOCIN 10 UNIT/ML IJ SOLN
40.0000 [IU] | INTRAVENOUS | Status: DC | PRN
  Administered 2012-03-25: 40 [IU] via INTRAVENOUS

## 2012-03-25 MED ORDER — PROMETHAZINE HCL 25 MG/ML IJ SOLN: 6.2500 mg | INTRAMUSCULAR | Status: DC | PRN

## 2012-03-25 MED ORDER — BUPIVACAINE IN DEXTROSE 0.75-8.25 % IT SOLN
INTRATHECAL | Status: DC | PRN
  Administered 2012-03-25: 1.4 mg via INTRATHECAL

## 2012-03-25 MED ORDER — NALOXONE HCL 0.4 MG/ML IJ SOLN: 0.4000 mg | INTRAMUSCULAR | Status: DC | PRN

## 2012-03-25 MED ORDER — SCOPOLAMINE 1 MG/3DAYS TD PT72
1.0000 | MEDICATED_PATCH | Freq: Once | TRANSDERMAL | Status: AC
  Administered 2012-03-25: 1.5 mg via TRANSDERMAL

## 2012-03-25 MED ORDER — SODIUM CHLORIDE 0.9 % IJ SOLN: 3.0000 mL | INTRAMUSCULAR | Status: DC | PRN

## 2012-03-25 MED ORDER — WITCH HAZEL-GLYCERIN EX PADS: 1.0000 "application " | MEDICATED_PAD | CUTANEOUS | Status: DC | PRN

## 2012-03-25 MED ORDER — TETANUS-DIPHTH-ACELL PERTUSSIS 5-2.5-18.5 LF-MCG/0.5 IM SUSP
0.5000 mL | Freq: Once | INTRAMUSCULAR | Status: AC
  Administered 2012-03-26: 0.5 mL via INTRAMUSCULAR
  Filled 2012-03-25: qty 0.5

## 2012-03-25 MED ORDER — NALOXONE HCL 0.4 MG/ML IJ SOLN
1.0000 ug/kg/h | INTRAMUSCULAR | Status: DC | PRN
  Filled 2012-03-25: qty 2.5

## 2012-03-25 MED ORDER — IBUPROFEN 600 MG PO TABS
600.0000 mg | ORAL_TABLET | Freq: Four times a day (QID) | ORAL | Status: DC
  Administered 2012-03-25 – 2012-03-28 (×12): 600 mg via ORAL
  Filled 2012-03-25 (×12): qty 1

## 2012-03-25 MED ORDER — FENTANYL CITRATE 0.05 MG/ML IJ SOLN
INTRAMUSCULAR | Status: DC | PRN
  Administered 2012-03-25: 75 ug via INTRAVENOUS

## 2012-03-25 MED ORDER — MENTHOL 3 MG MT LOZG: 1.0000 | LOZENGE | OROMUCOSAL | Status: DC | PRN

## 2012-03-25 MED ORDER — SCOPOLAMINE 1 MG/3DAYS TD PT72
MEDICATED_PATCH | TRANSDERMAL | Status: AC
  Filled 2012-03-25: qty 1

## 2012-03-25 MED ORDER — KETOROLAC TROMETHAMINE 30 MG/ML IJ SOLN: 15.0000 mg | Freq: Once | INTRAMUSCULAR | Status: DC | PRN

## 2012-03-25 MED ORDER — FENTANYL CITRATE 0.05 MG/ML IJ SOLN
INTRAMUSCULAR | Status: DC | PRN
  Administered 2012-03-25: 25 ug via INTRATHECAL

## 2012-03-25 MED ORDER — DIPHENHYDRAMINE HCL 50 MG/ML IJ SOLN: 12.5000 mg | INTRAMUSCULAR | Status: DC | PRN

## 2012-03-25 MED ORDER — MAGNESIUM SULFATE 40 G IN LACTATED RINGERS - SIMPLE
2.0000 g/h | INTRAVENOUS | Status: DC
  Administered 2012-03-25 – 2012-03-26 (×3): 2 g/h via INTRAVENOUS
  Filled 2012-03-25 (×2): qty 500

## 2012-03-25 MED ORDER — CEFAZOLIN SODIUM-DEXTROSE 2-3 GM-% IV SOLR: 2.0000 g | INTRAVENOUS | Status: AC

## 2012-03-25 MED ORDER — SENNOSIDES-DOCUSATE SODIUM 8.6-50 MG PO TABS
2.0000 | ORAL_TABLET | Freq: Every day | ORAL | Status: DC
  Administered 2012-03-25 – 2012-03-27 (×3): 2 via ORAL

## 2012-03-25 MED ORDER — LACTATED RINGERS IV SOLN
INTRAVENOUS | Status: DC | PRN
  Administered 2012-03-25 (×3): via INTRAVENOUS

## 2012-03-25 MED ORDER — LACTATED RINGERS IV SOLN
INTRAVENOUS | Status: DC
  Administered 2012-03-25 (×2): via INTRAVENOUS

## 2012-03-25 MED ORDER — NALBUPHINE SYRINGE 5 MG/0.5 ML
INJECTION | INTRAMUSCULAR | Status: AC
  Administered 2012-03-25: 10 mg via SUBCUTANEOUS
  Filled 2012-03-25: qty 1

## 2012-03-25 MED ORDER — METOCLOPRAMIDE HCL 5 MG/ML IJ SOLN: 10.0000 mg | Freq: Three times a day (TID) | INTRAMUSCULAR | Status: DC | PRN

## 2012-03-25 SURGICAL SUPPLY — 35 items
ADH SKN CLS APL DERMABOND .7 (GAUZE/BANDAGES/DRESSINGS)
APL SKNCLS STERI-STRIP NONHPOA (GAUZE/BANDAGES/DRESSINGS)
BENZOIN TINCTURE PRP APPL 2/3 (GAUZE/BANDAGES/DRESSINGS) IMPLANT
CLOTH BEACON ORANGE TIMEOUT ST (SAFETY) ×2 IMPLANT
CONTAINER PREFILL 10% NBF 15ML (MISCELLANEOUS) IMPLANT
DERMABOND ADVANCED (GAUZE/BANDAGES/DRESSINGS)
DERMABOND ADVANCED .7 DNX12 (GAUZE/BANDAGES/DRESSINGS) IMPLANT
DRAPE SURG 17X23 STRL (DRAPES) ×1 IMPLANT
DRESSING TELFA 8X3 (GAUZE/BANDAGES/DRESSINGS) IMPLANT
DRSG COVADERM 4X10 (GAUZE/BANDAGES/DRESSINGS) ×1 IMPLANT
DURAPREP 26ML APPLICATOR (WOUND CARE) ×1 IMPLANT
ELECT REM PT RETURN 9FT ADLT (ELECTROSURGICAL) ×2
ELECTRODE REM PT RTRN 9FT ADLT (ELECTROSURGICAL) ×1 IMPLANT
EXTRACTOR VACUUM M CUP 4 TUBE (SUCTIONS) IMPLANT
GAUZE SPONGE 4X4 12PLY STRL LF (GAUZE/BANDAGES/DRESSINGS) ×3 IMPLANT
GLOVE BIO SURGEON STRL SZ8 (GLOVE) ×4 IMPLANT
GOWN PREVENTION PLUS LG XLONG (DISPOSABLE) ×2 IMPLANT
KIT ABG SYR 3ML LUER SLIP (SYRINGE) ×2 IMPLANT
NDL HYPO 25X5/8 SAFETYGLIDE (NEEDLE) ×1 IMPLANT
NEEDLE HYPO 25X5/8 SAFETYGLIDE (NEEDLE) ×2 IMPLANT
NS IRRIG 1000ML POUR BTL (IV SOLUTION) ×2 IMPLANT
PACK C SECTION WH (CUSTOM PROCEDURE TRAY) ×2 IMPLANT
PAD ABD 7.5X8 STRL (GAUZE/BANDAGES/DRESSINGS) ×1 IMPLANT
PAD OB MATERNITY 4.3X12.25 (PERSONAL CARE ITEMS) ×1 IMPLANT
SLEEVE SCD COMPRESS KNEE MED (MISCELLANEOUS) ×1 IMPLANT
STAPLER VISISTAT 35W (STAPLE) ×1 IMPLANT
STRIP CLOSURE SKIN 1/2X4 (GAUZE/BANDAGES/DRESSINGS) IMPLANT
SUT MNCRL 0 VIOLET CTX 36 (SUTURE) ×4 IMPLANT
SUT MONOCRYL 0 CTX 36 (SUTURE) ×4
SUT PDS AB 0 CTX 60 (SUTURE) ×2 IMPLANT
SUT PLAIN 0 NONE (SUTURE) IMPLANT
SUT VIC AB 4-0 KS 27 (SUTURE) ×2 IMPLANT
TOWEL OR 17X24 6PK STRL BLUE (TOWEL DISPOSABLE) ×4 IMPLANT
TRAY FOLEY CATH 14FR (SET/KITS/TRAYS/PACK) ×2 IMPLANT
WATER STERILE IRR 1000ML POUR (IV SOLUTION) ×1 IMPLANT

## 2012-03-25 NOTE — Op Note (Signed)
NAMECAROLINE, Yvonne [name]               ACCOUNT NO.:  [id number]  MEDICAL RECORD NO.:  [id number]  LOCATION:  9372                          FACILITY:  [facility]  PHYSICIAN:  Guy Sandifer. Henderson Cloud, M.D. DATE OF BIRTH:  27-Feb-1991  DATE OF PROCEDURE:  03/25/2012 DATE OF DISCHARGE:                              OPERATIVE REPORT   PREOPERATIVE DIAGNOSIS:  Suspected placental abruption.  POSTOPERATIVE DIAGNOSIS:  Suspected placental abruption.  PROCEDURE:  Emergent low-transverse cesarean section.  SURGEON:  Guy Sandifer. Henderson Cloud, M.D.  ANESTHESIA:  Spinal, Leilani Able, M.D.  ESTIMATED BLOOD LOSS:  750 mL.  FINDINGS:  Viable female infant, Apgars of 7 and 9 at 1 and 5 minutes respectively.  Birth weight and arterial cord pH pending.  SPECIMENS:  Placenta to Pathology.  INDICATIONS AND CONSENT:  This patient is a 21 year old G1, P0 at 35- 1/7th weeks who was admitted for induction for preeclampsia.  She underwent Cytotec x2 doses.  Further Cytotec was withheld after the last dose because of some decelerations.  The decelerations had resolved. However, the patient complained of abdominal pain and was noted to pass a very large clot of blood.  Fundus was firm to examination.  Fetal heart tones were 150s with repetitive variable decelerations.  Because of suspected placental abruption, immediate cesarean section was recommended.  Potential risks and complications were discussed with the patient preoperatively including, but not limited to, infection, organ damage, bleeding requiring transfusion of blood products with HIV and hepatitis acquisition, DVT, PE, and pneumonia.  All questions were answered and consent was signed on the chart.  PROCEDURE:  The patient was taken to the operating room where she was identified, spinal anesthetic was placed per Dr. Arby Barrette and she was placed in the dorsal supine position with a 15-degree left lateral wedge.  Time-out was undertaken.  She was prepped.  Bladder  was catheterized with the Foley indwelling catheter and she was draped in a sterile fashion.  After testing for adequate spinal anesthesia, skin was entered through a Pfannenstiel incision and dissection was carried out in layers to the peritoneum.  Peritoneum was incised and extended superiorly and inferiorly.  Vesicouterine peritoneum was taken down cephalad laterally.  Bladder flap was developed and bladder blade was placed.  Uterus was incised in a low-transverse manner and the uterine cavity was entered bluntly with a hemostat.  The uterine incision was extended cephalad laterally with fingers.  Vertex was then delivered. Nuchal cord x1 was noted.  There was also a cord down by the baby's head noted upon entry into the uterus.  The baby was delivered without difficulty.  Good cry and tone was noted.  Cord was clamped and cut, and the baby was handed to awaiting pediatrics team.  Placenta was manually delivered and sent to Pathology.  Uterine cavity was clean.  Uterus was closed in 2 running locking imbricating layers of 0 Monocryl suture, which achieved good hemostasis.  Anterior peritoneum was closed in a running fashion with 0 Monocryl suture, which was also used to reapproximate the pyramidalis muscle in midline.  Anterior rectus fascia was closed in a running fashion with a 0 looped PDS suture.  Skin was closed with clips.  All sponge, instrument, and needle counts were correct, and the patient was transferred to the recovery room in stable condition.     Guy Sandifer Henderson Cloud, M.D.     JET/MEDQ  D:  03/25/2012  T:  03/25/2012  Job:  [id number]

## 2012-03-25 NOTE — Progress Notes (Signed)
Stood at [facility] x 5 minutes, linens freshened. Pt able to tolerate well with pain limited.

## 2012-03-25 NOTE — Addendum Note (Signed)
Addendum  created 03/25/12 0916 by Sonakshi Rolland S Jerrald Doverspike, CRNA   Modules edited:Notes Section    

## 2012-03-25 NOTE — Progress Notes (Signed)
Patient passed large clot  Now c/o constant abdominal pain  Fundus firm FHT 150s with repetitive variable decels  A:suspicous for placental abruption P: recommend immediate cesarean section      D/W patient / husband-d/w risks-infection, organ damage, bleeding/transfusion-HIV/Hep, DVT/PE, pneumonia.  All questions answered.

## 2012-03-25 NOTE — Anesthesia Preprocedure Evaluation (Addendum)
Anesthesia Evaluation  Patient identified by MRN, date of birth, ID band Patient awake    Reviewed: Allergy & Precautions, H&P , NPO status , Patient's Chart, lab work & pertinent test results  Airway Mallampati: III TM Distance: >3 FB Neck ROM: full    Dental No notable dental hx.    Pulmonary neg pulmonary ROS,    Pulmonary exam normal       Cardiovascular negative cardio ROS      Neuro/Psych negative neurological ROS  negative psych ROS   GI/Hepatic negative GI ROS, Neg liver ROS,   Endo/Other  negative endocrine ROSMorbid obesity  Renal/GU negative Renal ROS  negative genitourinary   Musculoskeletal negative musculoskeletal ROS (+)   Abdominal (+) + obese,   Peds negative pediatric ROS (+)  Hematology negative hematology ROS (+)   Anesthesia Other Findings   Reproductive/Obstetrics (+) Pregnancy                           Anesthesia Physical Anesthesia Plan  ASA: III and Emergent  Anesthesia Plan: Spinal   Post-op Pain Management:    Induction:   Airway Management Planned:   Additional Equipment:   Intra-op Plan:   Post-operative Plan:   Informed Consent: I have reviewed the patients History and Physical, chart, labs and discussed the procedure including the risks, benefits and alternatives for the proposed anesthesia with the patient or authorized representative who has indicated his/her understanding and acceptance.     Plan Discussed with: CRNA and Surgeon  Anesthesia Plan Comments: (This pt was offered a full meal including meat at 2200. She was a cytotec induction.)       Anesthesia Quick Evaluation

## 2012-03-25 NOTE — Anesthesia Postprocedure Evaluation (Signed)
  Anesthesia Post-op Note  Patient: Yvonne [name]  Procedure(s) Performed: Procedure(s) (LRB) with comments: CESAREAN SECTION (N/A) - Primary Cesarean Section Delivery Boy @ [phone], Apgars 7/9  Patient Location: PACU and A-ICU  Anesthesia Type:Spinal  Level of Consciousness: awake, alert  and oriented  Airway and Oxygen Therapy: Patient Spontanous Breathing  Post-op Pain: mild  Post-op Assessment: Patient's Cardiovascular Status Stable, Respiratory Function Stable, No signs of Nausea or vomiting, Adequate PO intake and Pain level controlled  Post-op Vital Signs: stable  Complications: No apparent anesthesia complications

## 2012-03-25 NOTE — Anesthesia Postprocedure Evaluation (Signed)
Anesthesia Post Note  Patient: Yvonne [name]  Procedure(s) Performed: Procedure(s) (LRB): CESAREAN SECTION (N/A)  Anesthesia type: Spinal  Patient location: PACU  Post pain: Pain level controlled  Post assessment: Post-op Vital signs reviewed  Last Vitals:  Filed Vitals:   03/25/12 0315  BP: 108/66  Pulse: 108  Temp:   Resp: 18    Post vital signs: Reviewed  Level of consciousness: awake  Complications: No apparent anesthesia complications

## 2012-03-25 NOTE — Transfer of Care (Signed)
Immediate Anesthesia Transfer of Care Note  Patient: Yvonne [name]  Procedure(s) Performed: Procedure(s) (LRB) with comments: CESAREAN SECTION (N/A)  Patient Location: PACU  Anesthesia Type:Spinal  Level of Consciousness: awake  Airway & Oxygen Therapy: Patient Spontanous Breathing  Post-op Assessment: Report given to PACU RN and Post -op Vital signs reviewed and stable  Post vital signs: stable  Complications: No apparent anesthesia complications

## 2012-03-25 NOTE — Anesthesia Procedure Notes (Signed)
Spinal  Patient location during procedure: OR Start time: 03/25/2012 2:06 AM End time: 03/25/2012 2:11 AM Staffing Anesthesiologist: Sandrea Hughs Performed by: anesthesiologist  Preanesthetic Checklist Completed: patient identified, site marked, surgical consent, pre-op evaluation, timeout performed, IV checked, risks and benefits discussed and monitors and equipment checked Spinal Block Patient position: sitting Prep: DuraPrep Patient monitoring: heart rate, cardiac monitor, continuous pulse ox and blood pressure Approach: midline Location: L3-4 Injection technique: single-shot Needle Needle type: Sprotte  Needle gauge: 24 G Needle length: 9 cm Assessment Sensory level: T6

## 2012-03-25 NOTE — Brief Op Note (Signed)
03/24/2012 - 03/25/2012  2:44 AM  PATIENT:  Yvonne [name]  21 y.o. female  PRE-OPERATIVE DIAGNOSIS:  Placental abruption  POST-OPERATIVE DIAGNOSIS:  Placental abruption  PROCEDURE:  Procedure(s) (LRB) with comments: CESAREAN SECTION (N/A)  SURGEON:  Surgeon(s) and Role:    * Leslie Andrea, MD - Primary  PHYSICIAN ASSISTANT:   ASSISTANTS: none   ANESTHESIA:   spinal  EBL:  Total I/O In: 3000 [I.V.:3000] Out: 1000 [Urine:200; Blood:800]  BLOOD ADMINISTERED:none  DRAINS: Urinary Catheter (Foley)   LOCAL MEDICATIONS USED:  NONE  SPECIMEN:  Source of Specimen:  placenta  DISPOSITION OF SPECIMEN:  PATHOLOGY  COUNTS:  YES  TOURNIQUET:  * No tourniquets in log *  DICTATION: .Other Dictation: Dictation Number [id number]  PLAN OF CARE: Admit to inpatient   PATIENT DISPOSITION:  PACU - hemodynamically stable.   Delay start of Pharmacological VTE agent (>24hrs) due to surgical blood loss or risk of bleeding: not applicable

## 2012-03-25 NOTE — Consult Note (Signed)
Neonatology Note:  Attendance at C-section:  I was asked to attend this primary C/S at 37 1/7 weeks due to possible abruption. The mother is a G1P0 O pos, GBS neg with PIH over the past 2-3 weeks, being induced. She passed a large blood clot per vagina just prior to C/S. ROM at delivery, fluid clear. CAN times 1 loosely. Infant with slightly decreased tone, but cried with stimulation. Needed only minimal bulb suctioning. Baby seemed a bit sleepy, but no history of maternal medications. By 3-4 minutes, respirations were regular and he always maintained his HR with good color. O2 saturation 90+% at 5 min. Ap 7/9. Lungs clear to ausc in DR. To CN to care of Pediatrician.  Offie Pickron, MD  

## 2012-03-25 NOTE — Progress Notes (Signed)
DOD Good pain control  Blood pressure 130/98, pulse 115, temperature 98.5 F (36.9 C), temperature source Oral, resp. rate 19, height 5' (1.524 m), weight 92.534 kg (204 lb), last menstrual period 07/10/2011, SpO2 99.00%, unknown if currently breastfeeding. UO clear  Lungs CTA Cor RRR Abd soft, good BS, Dressing C&D Ext NT CMET OK, CBC pending A: PP     Preeclampsia  P: Continue magnesium sulfate

## 2012-03-25 NOTE — Progress Notes (Signed)
Instructed and demonstrated use of IS.

## 2012-03-26 LAB — CBC
HCT: 29.4 % — ABNORMAL LOW (ref 36.0–46.0)
MCV: 88 fL (ref 78.0–100.0)
Platelets: 163 10*3/uL (ref 150–400)
RBC: 3.34 MIL/uL — ABNORMAL LOW (ref 3.87–5.11)
WBC: 7.9 10*3/uL (ref 4.0–10.5)

## 2012-03-26 NOTE — Progress Notes (Signed)
Pt transferred to 134. Pt states ' who do I tell I just want to formula feed, breastfeeding is just too hard.?".  Pt advised we would continue to support her Breastfeeding efforts if she changed her mind.

## 2012-03-26 NOTE — Progress Notes (Signed)
POD #1 Tolerating regular diet, passing flatus  Blood pressure 147/62, pulse 107, temperature 97.8 F (36.6 C), temperature source Oral, resp. rate 18, height 5' (1.524 m), weight 93.895 kg (207 lb), last menstrual period 07/10/2011, SpO2 99.00%, unknown if currently breastfeeding.  UO 250-600 cc/hr  Lungs CTA Cor RRR Abd soft, BS + Dressing C&D  Results for orders placed during the hospital encounter of 03/24/12 (from the past 24 hour(s))  CBC     Status: Abnormal   Collection Time   03/25/12 11:00 AM      Component Value Range   WBC 16.0 (*) 4.0 - 10.5 K/uL   RBC 3.87  3.87 - 5.11 MIL/uL   Hemoglobin 11.1 (*) 12.0 - 15.0 g/dL   HCT 16.1 (*) 09.6 - 04.5 %   MCV 87.1  78.0 - 100.0 fL   MCH 28.7  26.0 - 34.0 pg   MCHC 32.9  30.0 - 36.0 g/dL   RDW 40.9  81.1 - 91.4 %   Platelets 189  150 - 400 K/uL  CBC     Status: Abnormal   Collection Time   03/26/12  6:00 AM      Component Value Range   WBC 7.9  4.0 - 10.5 K/uL   RBC 3.34 (*) 3.87 - 5.11 MIL/uL   Hemoglobin 9.5 (*) 12.0 - 15.0 g/dL   HCT 78.2 (*) 95.6 - 21.3 %   MCV 88.0  78.0 - 100.0 fL   MCH 28.4  26.0 - 34.0 pg   MCHC 32.3  30.0 - 36.0 g/dL   RDW 08.6  57.8 - 46.9 %   Platelets 163  150 - 400 K/uL  A: Preeclampsia, inproved     PO C/S  P: Stop magnesium sulfate     Transfer to floor

## 2012-03-27 ENCOUNTER — Encounter (HOSPITAL_COMMUNITY): Payer: Self-pay | Admitting: Obstetrics and Gynecology

## 2012-03-27 NOTE — Progress Notes (Signed)
Subjective: Postpartum Day 2: Cesarean Delivery Patient reports tolerating PO, + flatus and no problems voiding.    Objective: Vital signs in last 24 hours: Temp:  [98.3 F (36.8 C)-98.5 F (36.9 C)] 98.4 F (36.9 C) (11/04 0559) Pulse Rate:  [84-117] 100  (11/04 0559) Resp:  [18-20] 20  (11/04 0559) BP: (121-160)/(62-100) 131/89 mmHg (11/04 0559) SpO2:  [96 %-100 %] 98 % (11/04 0559)  Physical Exam:  General: alert and cooperative Lochia: appropriate Uterine Fundus: firm Incision: healing well, staples intact DVT Evaluation: No evidence of DVT seen on physical exam. Negative Homan's sign. No cords or calf tenderness. No significant calf/ankle edema. DTR's 1+   Basename 03/26/12 0600 03/25/12 1100  HGB 9.5* 11.1*  HCT 29.4* 33.7*    Assessment/Plan: Status post Cesarean section. Postoperative course complicated by [facility]  Continue current care.  Kalkidan Caudell G 03/27/2012, 8:14 AM

## 2012-03-28 MED ORDER — IBUPROFEN 600 MG PO TABS: 600.0000 mg | ORAL_TABLET | Freq: Four times a day (QID) | ORAL | Status: DC

## 2012-03-28 MED ORDER — OXYCODONE-ACETAMINOPHEN 5-325 MG PO TABS: 1.0000 | ORAL_TABLET | ORAL | Status: DC | PRN

## 2012-03-28 NOTE — Discharge Summary (Signed)
Obstetric Discharge Summary Reason for Admission: induction of labor Prenatal Procedures: NST and ultrasound Intrapartum Procedures: cesarean: low cervical, transverse Postpartum Procedures: none Complications-Operative and Postpartum: none Hemoglobin  Date Value Range Status  03/26/2012 9.5* 12.0 - 15.0 [name]/dL Final     HCT  Date Value Range Status  03/26/2012 29.4* 36.0 - 46.0 % Final    Physical Exam:  General: alert and cooperative, denies HA or RUQ pain Lochia: appropriate Uterine Fundus: firm Incision: healing well, staples removed DVT Evaluation: No evidence of DVT seen on physical exam. Negative Homan's sign. No cords or calf tenderness. DTR's 1+ small pedal edema  Discharge Diagnoses: Term Pregnancy-delivered  Discharge Information: Date: 03/28/2012 Activity: pelvic rest Diet: routine Medications: PNV, Ibuprofen and Percocet Condition: stable Instructions: refer to practice specific booklet.  To call for increase in swelling, jitteriness, HA unsolved with Tylenol  Discharge to: home RTO in 1 week for incision and BP check.   Newborn Data: Live born female  Birth Weight: 5 lb 11 oz (2580 [name]) APGAR: 7, 9  Home with mother.  Yvonne [name] 03/28/2012, 8:16 AM

## 2012-08-05 ENCOUNTER — Encounter (HOSPITAL_COMMUNITY): Payer: Self-pay | Admitting: Emergency Medicine

## 2012-08-05 ENCOUNTER — Emergency Department (HOSPITAL_COMMUNITY)
Admission: EM | Admit: 2012-08-05 | Discharge: 2012-08-06 | Disposition: A | Discharge status: Home / Self Care | Payer: BC Managed Care – PPO | Attending: Emergency Medicine | Admitting: Emergency Medicine

## 2012-08-05 DIAGNOSIS — N949 Unspecified condition associated with female genital organs and menstrual cycle: Secondary | ICD-10-CM | POA: Insufficient documentation

## 2012-08-05 DIAGNOSIS — R1110 Vomiting, unspecified: Secondary | ICD-10-CM | POA: Insufficient documentation

## 2012-08-05 DIAGNOSIS — Z3202 Encounter for pregnancy test, result negative: Secondary | ICD-10-CM | POA: Insufficient documentation

## 2012-08-05 DIAGNOSIS — O139 Gestational [pregnancy-induced] hypertension without significant proteinuria, unspecified trimester: Secondary | ICD-10-CM | POA: Insufficient documentation

## 2012-08-05 DIAGNOSIS — Z9889 Other specified postprocedural states: Secondary | ICD-10-CM | POA: Insufficient documentation

## 2012-08-05 DIAGNOSIS — Z9089 Acquired absence of other organs: Secondary | ICD-10-CM | POA: Insufficient documentation

## 2012-08-05 DIAGNOSIS — R102 Pelvic and perineal pain: Secondary | ICD-10-CM

## 2012-08-05 DIAGNOSIS — Z975 Presence of (intrauterine) contraceptive device: Secondary | ICD-10-CM | POA: Insufficient documentation

## 2012-08-05 HISTORY — DX: Gestational (pregnancy-induced) hypertension without significant proteinuria, unspecified trimester: O13.9

## 2012-08-05 LAB — CBC
Hemoglobin: 14.6 g/dL (ref 12.0–15.0)
MCH: 28.3 pg (ref 26.0–34.0)
MCV: 82.2 fL (ref 78.0–100.0)
RBC: 5.16 MIL/uL — ABNORMAL HIGH (ref 3.87–5.11)

## 2012-08-05 LAB — BASIC METABOLIC PANEL
CO2: 24 mEq/L (ref 19–32)
Calcium: 9.5 mg/dL (ref 8.4–10.5)
Chloride: 106 mEq/L (ref 96–112)
Glucose, Bld: 94 mg/dL (ref 70–99)
Sodium: 140 mEq/L (ref 135–145)

## 2012-08-05 LAB — URINE MICROSCOPIC-ADD ON

## 2012-08-05 LAB — URINALYSIS, ROUTINE W REFLEX MICROSCOPIC
Glucose, UA: NEGATIVE mg/dL
Ketones, ur: NEGATIVE mg/dL
Leukocytes, UA: NEGATIVE
Specific Gravity, Urine: 1.034 — ABNORMAL HIGH (ref 1.005–1.030)
pH: 6 (ref 5.0–8.0)

## 2012-08-05 LAB — POCT PREGNANCY, URINE: Preg Test, Ur: NEGATIVE

## 2012-08-05 NOTE — ED Provider Notes (Signed)
History     CSN: [id number]  Arrival date & time 08/05/12  2157   First MD Initiated Contact with Patient 08/05/12 2311      Chief Complaint  Patient presents with  . Abdominal Cramping  . Emesis    (Consider location/radiation/quality/duration/timing/severity/associated sxs/prior treatment) HPI  Generally healthy 22 yo F presents with complaints of diffuse lower abdominal cramping which began at approx 1700 today while she was at work. Pain has been constant but waxes and wanes in severity. Worse with movements. Pt notes that her pain was so severe that she vomited while at work. Says that employer advised her to come to the ED to get checked out before she could return to work.   6/10 pain, nonradiating. No dysuria, vag d/c, diarrhea. Patient notes that she has had daily bleeding since Merena was placed 4 months ago - average use of 3 pads or tampons per day. She has experienced some lower abdominal cramping over the past few months but, nothing this severe.   She is s/p appendectomy and c section.   Past Medical History  Diagnosis Date  . No pertinent past medical history   . Gestational hypertension     Past Surgical History  Procedure Laterality Date  . Appendectomy    . Cesarean section  03/25/2012    Procedure: CESAREAN SECTION;  Surgeon: Leslie Andrea, MD;  Location: [facility] ORS;  Service: Obstetrics;  Laterality: N/A;  Primary Cesarean Section Delivery Boy @ [phone], Apgars 7/9    Family History  Problem Relation Age of Onset  . Diabetes Mother   . Hypertension Mother   . Hypertension Father   . Heart disease Father   . Diabetes Maternal Grandmother   . Diabetes Maternal Grandfather   . Hypertension Paternal Grandfather     History  Substance Use Topics  . Smoking status: Never Smoker   . Smokeless tobacco: Not on file  . Alcohol Use: No    OB History   Grav Para Term Preterm Abortions TAB SAB Ect Mult Living   1 1 1       1       Review of  Systems Gen: no weight loss, fevers, chills, night sweats Eyes: no discharge or drainage, no occular pain or visual changes Nose: no epistaxis or rhinorrhea Mouth: no dental pain, no sore throat Neck: no neck pain Lungs: no SOB, cough, wheezing CV: no chest pain, palpitations, dependent edema or orthopnea Abd: As per history of present illness, otherwise negative GU: no dysuria or gross hematuria MSK: no myalgias or arthralgias Neuro: no headache, no focal neurologic deficits Skin: no rash Psyche: negative.  Allergies  Review of patient's allergies indicates no known allergies.  Home Medications  No current outpatient prescriptions on file.  BP 126/76  Temp(Src) 98.3 F (36.8 C) (Oral)  Resp 18  SpO2 98%  Physical Exam Gen: well developed and well nourished appearing Head: NCAT Eyes: PERL, EOMI Nose: no epistaixis or rhinorrhea Mouth/throat: mucosa is moist and pink Neck: supple, no stridor Lungs: CTA B, no wheezing, rhonchi or rales CV: Regular rate and rhythm, no murmur, extremities well perfused Abd: soft, notender, nondistended Back: no ttp, no cva ttp Skin: no rashese, wnl Neuro: CN ii-xii grossly intact, no focal deficits Psyche; normal affect,  calm and cooperative.   ED Course  Procedures (including critical care time)  Results for orders placed during the hospital encounter of 08/05/12 (from the past 24 hour(s))  CBC  Status: Abnormal   Collection Time    08/05/12 10:29 PM      Result Value Range   WBC 11.5 (*) 4.0 - 10.5 K/uL   RBC 5.16 (*) 3.87 - 5.11 MIL/uL   Hemoglobin 14.6  12.0 - 15.0 g/dL   HCT 16.1  09.6 - 04.5 %   MCV 82.2  78.0 - 100.0 fL   MCH 28.3  26.0 - 34.0 pg   MCHC 34.4  30.0 - 36.0 g/dL   RDW 40.9  81.1 - 91.4 %   Platelets 303  150 - 400 K/uL  BASIC METABOLIC PANEL     Status: None   Collection Time    08/05/12 10:29 PM      Result Value Range   Sodium 140  135 - 145 mEq/L   Potassium 3.9  3.5 - 5.1 mEq/L   Chloride 106   96 - 112 mEq/L   CO2 24  19 - 32 mEq/L   Glucose, Bld 94  70 - 99 mg/dL   BUN 16  6 - 23 mg/dL   Creatinine, Ser 7.82  0.50 - 1.10 mg/dL   Calcium 9.5  8.4 - 95.6 mg/dL   GFR calc non Af Amer >90  >90 mL/min   GFR calc Af Amer >90  >90 mL/min  LIPASE, BLOOD     Status: None   Collection Time    08/05/12 10:29 PM      Result Value Range   Lipase 33  11 - 59 U/L  URINALYSIS, ROUTINE W REFLEX MICROSCOPIC     Status: Abnormal   Collection Time    08/05/12 10:35 PM      Result Value Range   Color, Urine YELLOW  YELLOW   APPearance CLEAR  CLEAR   Specific Gravity, Urine 1.034 (*) 1.005 - 1.030   pH 6.0  5.0 - 8.0   Glucose, UA NEGATIVE  NEGATIVE mg/dL   Hgb urine dipstick LARGE (*) NEGATIVE   Bilirubin Urine NEGATIVE  NEGATIVE   Ketones, ur NEGATIVE  NEGATIVE mg/dL   Protein, ur NEGATIVE  NEGATIVE mg/dL   Urobilinogen, UA 1.0  0.0 - 1.0 mg/dL   Nitrite NEGATIVE  NEGATIVE   Leukocytes, UA NEGATIVE  NEGATIVE  URINE MICROSCOPIC-ADD ON     Status: Abnormal   Collection Time    08/05/12 10:35 PM      Result Value Range   Squamous Epithelial / LPF FEW (*) RARE   WBC, UA 0-2  <3 WBC/hpf   RBC / HPF 11-20  <3 RBC/hpf   Bacteria, UA FEW (*) RARE  POCT PREGNANCY, URINE     Status: None   Collection Time    08/05/12 10:51 PM      Result Value Range   Preg Test, Ur NEGATIVE  NEGATIVE      MDM  Etiology of pelvic/lower abd cramping unclear based on exam and labs. We have ruled out UTI and ectopic pregnancy, pt is s/p appy remotely. Adnexal lesion a possibility but, pain was diffuse and bilateral.  I planned to perform a pelvic exam to evaluate for cervicitis/pid but, patient abruptly said that she needed to leave and could not stay.  Patient advised of concern for PID and need for pelvic exam for further eval. Patient says she will call her GYN's office tomorrow to get an appt in the next couple of days for evaluation of discomfort and to discuss removal of Merena.         Raynelle Fanning  Lavella Lemons, MD 08/06/12 [phone]

## 2012-08-05 NOTE — ED Notes (Signed)
Patient complaining of abdominal cramping that began this afternoon at work; becoming more severe over the course of the day.  Patient reports three episodes of emesis today; denies diarrhea.  Patient reports that she had an IUD placed four months ago and that she has had a constant period since the placement.  Reports intermittent abdominal cramping since IUD placement.

## 2012-08-06 NOTE — ED Notes (Signed)
IUD placed back in December 2013. Concerned it may causing these symptoms.

## 2013-01-15 ENCOUNTER — Ambulatory Visit (INDEPENDENT_AMBULATORY_CARE_PROVIDER_SITE_OTHER): Payer: BC Managed Care – PPO | Admitting: General Practice

## 2013-01-15 ENCOUNTER — Encounter: Payer: Self-pay | Admitting: General Practice

## 2013-01-15 VITALS — BP 114/77 | HR 92 | Temp 99.7°F | Ht 60.0 in | Wt 172.0 lb

## 2013-01-15 DIAGNOSIS — F909 Attention-deficit hyperactivity disorder, unspecified type: Secondary | ICD-10-CM

## 2013-01-15 MED ORDER — LISDEXAMFETAMINE DIMESYLATE 30 MG PO CAPS: 30.0000 mg | ORAL_CAPSULE | ORAL | Status: DC

## 2013-01-15 NOTE — Patient Instructions (Addendum)
Attention Deficit Hyperactivity Disorder Attention deficit hyperactivity disorder (ADHD) is a problem with behavior issues based on the way the brain functions (neurobehavioral disorder). It is a common reason for behavior and academic problems in school. CAUSES  The cause of ADHD is unknown in most cases. It may run in families. It sometimes can be associated with learning disabilities and other behavioral problems. SYMPTOMS  There are 3 types of ADHD. The 3 types and some of the symptoms include:  Inattentive  Gets bored or distracted easily.  Loses or forgets things. Forgets to hand in homework.  Has trouble organizing or completing tasks.  Difficulty staying on task.  An inability to organize daily tasks and school work.  Leaving projects, chores, or homework unfinished.  Trouble paying attention or responding to details. Careless mistakes.  Difficulty following directions. Often seems like is not listening.  Dislikes activities that require sustained attention (like chores or homework).  Hyperactive-impulsive  Feels like it is impossible to sit still or stay in a seat. Fidgeting with hands and feet.  Trouble waiting turn.  Talking too much or out of turn. Interruptive.  Speaks or acts impulsively.  Aggressive, disruptive behavior.  Constantly busy or on the go, noisy.  Combined  Has symptoms of both of the above. Often children with ADHD feel discouraged about themselves and with school. They often perform well below their abilities in school. These symptoms can cause problems in home, school, and in relationships with peers. As children get older, the excess motor activities can calm down, but the problems with paying attention and staying organized persist. Most children do not outgrow ADHD but with good treatment can learn to cope with the symptoms. DIAGNOSIS  When ADHD is suspected, the diagnosis should be made by professionals trained in ADHD.  Diagnosis will  include:  Ruling out other reasons for the child's behavior.  The caregivers will check with the child's school and check their medical records.  They will talk to teachers and parents.  Behavior rating scales for the child will be filled out by those dealing with the child on a daily basis. A diagnosis is made only after all information has been considered. TREATMENT  Treatment usually includes behavioral treatment often along with medicines. It may include stimulant medicines. The stimulant medicines decrease impulsivity and hyperactivity and increase attention. Other medicines used include antidepressants and certain blood pressure medicines. Most experts agree that treatment for ADHD should address all aspects of the child's functioning. Treatment should not be limited to the use of medicines alone. Treatment should include structured classroom management. The parents must receive education to address rewarding good behavior, discipline, and limit-setting. Tutoring or behavioral therapy or both should be available for the child. If untreated, the disorder can have long-term serious effects into adolescence and adulthood. HOME CARE INSTRUCTIONS   Often with ADHD there is a lot of frustration among the family in dealing with the illness. There is often blame and anger that is not warranted. This is a life long illness. There is no way to prevent ADHD. In many cases, because the problem affects the family as a whole, the entire family may need help. A therapist can help the family find better ways to handle the disruptive behaviors and promote change. If the child is young, most of the therapist's work is with the parents. Parents will learn techniques for coping with and improving their child's behavior. Sometimes only the child with the ADHD needs counseling. Your caregivers can help   you make these decisions.  Children with ADHD may need help in organizing. Some helpful tips include:  Keep  routines the same every day from wake-up time to bedtime. Schedule everything. This includes homework and playtime. This should include outdoor and indoor recreation. Keep the schedule on the refrigerator or a bulletin board where it is frequently seen. Mark schedule changes as far in advance as possible.  Have a place for everything and keep everything in its place. This includes clothing, backpacks, and school supplies.  Encourage writing down assignments and bringing home needed books.  Offer your child a well-balanced diet. Breakfast is especially important for school performance. Children should avoid drinks with caffeine including:  Soft drinks.  Coffee.  Tea.  However, some older children (adolescents) may find these drinks helpful in improving their attention.  Children with ADHD need consistent rules that they can understand and follow. If rules are followed, give small rewards. Children with ADHD often receive, and expect, criticism. Look for good behavior and praise it. Set realistic goals. Give clear instructions. Look for activities that can foster success and self-esteem. Make time for pleasant activities with your child. Give lots of affection.  Parents are their children's greatest advocates. Learn as much as possible about ADHD. This helps you become a stronger and better advocate for your child. It also helps you educate your child's teachers and instructors if they feel inadequate in these areas. Parent support groups are often helpful. A national group with local chapters is called CHADD (Children and Adults with Attention Deficit Hyperactivity Disorder). PROGNOSIS  There is no cure for ADHD. Children with the disorder seldom outgrow it. Many find adaptive ways to accommodate the ADHD as they mature. SEEK MEDICAL CARE IF:  Your child has repeated muscle twitches, cough or speech outbursts.  Your child has sleep problems.  Your child has a marked loss of  appetite.  Your child develops depression.  Your child has new or worsening behavioral problems.  Your child develops dizziness.  Your child has a racing heart.  Your child has stomach pains.  Your child develops headaches. Document Released: 04/30/2002 Document Revised: 08/02/2011 Document Reviewed: 12/11/2007 Exit[employer] Patient Information 2014 [employer].  

## 2013-01-15 NOTE — Progress Notes (Signed)
  Subjective:    Patient ID: Yvonne [name], female    DOB: [birth year], 22 y.o.   MRN: [id number]  HPI Patient presents today with complaints of inability to focus, interrupting others while speaking, careless mistakes, misplacing and loosing items. She reports starting tasks and not able to complete. Reports being married and has a 36 month old son. She is interested in going back to school, but has concerns about being able to focus. She denies being depressed, having thoughts of harming self or others. Denies breast feeding. Reports last menstrual cycle began 2 weeks ago and it was normal.     Review of Systems  Constitutional: Negative for fever and chills.  Respiratory: Negative for chest tightness and shortness of breath.   Cardiovascular: Negative for chest pain and palpitations.  Genitourinary: Negative for dysuria and difficulty urinating.  Neurological: Negative for dizziness, weakness and headaches.       Objective:   Physical Exam  Constitutional: She is oriented to person, place, and time. She appears well-developed and well-nourished.  HENT:  Head: Normocephalic and atraumatic.  Right Ear: External ear normal.  Left Ear: External ear normal.  Nose: Nose normal.  Mouth/Throat: Oropharynx is clear and moist.  Eyes: EOM are normal. Pupils are equal, round, and reactive to light.  Neck: Normal range of motion. Neck supple. No thyromegaly present.  Cardiovascular: Normal rate, regular rhythm and normal heart sounds.   Pulmonary/Chest: Effort normal and breath sounds normal. No respiratory distress. She exhibits no tenderness.  Abdominal: Soft. Bowel sounds are normal. She exhibits no distension. There is no tenderness.  Musculoskeletal: She exhibits no edema and no tenderness.  Lymphadenopathy:    She has no cervical adenopathy.  Neurological: She is alert and oriented to person, place, and time.  Skin: Skin is warm and dry.  Psychiatric: She has a normal mood and affect.           Assessment & Plan:  1. ADHD (attention deficit hyperactivity disorder) - lisdexamfetamine (VYVANSE) 30 MG capsule; Take 1 capsule (30 mg total) by mouth every morning.  Dispense: 30 capsule; Refill: 0 -discussed side effects of medication -information sheet provided and discussed on ADHD -RTO on Wednesday for follow up and sooner if symptoms worsen, may also seek emergency medical treamtment -Patient verbalized understanding -Coralie Keens, FNP-C

## 2013-01-24 ENCOUNTER — Ambulatory Visit (INDEPENDENT_AMBULATORY_CARE_PROVIDER_SITE_OTHER): Payer: BC Managed Care – PPO | Admitting: General Practice

## 2013-01-24 ENCOUNTER — Encounter: Payer: Self-pay | Admitting: General Practice

## 2013-01-24 VITALS — BP 114/76 | HR 71 | Temp 98.3°F | Ht 60.0 in | Wt 167.0 lb

## 2013-01-24 DIAGNOSIS — F909 Attention-deficit hyperactivity disorder, unspecified type: Secondary | ICD-10-CM

## 2013-01-24 MED ORDER — LISDEXAMFETAMINE DIMESYLATE 50 MG PO CAPS: 50.0000 mg | ORAL_CAPSULE | ORAL | Status: DC

## 2013-01-24 NOTE — Progress Notes (Signed)
  Subjective:    Patient ID: Yvonne [name], female    DOB: March 12, 1991, 22 y.o.   MRN: [id number]  HPI Patient presents today for follow up evaluation of ADHD medication (vyvanse). Reports medication was very effective for first 4 days, then effectiveness tapered of. She reports increasing concentration and ability to focus. She denies misplacing or loosing items as often. She would like to continue taking medication. She denies thoughts of harming self or others.     Review of Systems  Constitutional: Negative for fever and chills.  Respiratory: Negative for chest tightness and shortness of breath.   Cardiovascular: Negative for chest pain and palpitations.  Genitourinary: Negative for dysuria and difficulty urinating.  Neurological: Negative for dizziness, weakness and headaches.  Psychiatric/Behavioral: Negative for suicidal ideas and self-injury.       Objective:   Physical Exam  Constitutional: She is oriented to person, place, and time. She appears well-developed and well-nourished.  Cardiovascular: Normal rate, regular rhythm and normal heart sounds.   Pulmonary/Chest: Effort normal and breath sounds normal. No respiratory distress. She exhibits no tenderness.  Neurological: She is alert and oriented to person, place, and time.  Skin: Skin is warm and dry.  Psychiatric: She has a normal mood and affect.          Assessment & Plan:  1. ADHD (attention deficit hyperactivity disorder) - lisdexamfetamine (VYVANSE) 50 MG capsule; Take 1 capsule (50 mg total) by mouth every morning.  Dispense: 30 capsule; Refill: 0 -discontinue vyvanse 30mg  and start taking 50mg  -RTO in one month for follow up and sooner if symptoms worsen -Patient verbalized understanding -Coralie Keens, FNP-C

## 2013-01-30 ENCOUNTER — Ambulatory Visit: Payer: BC Managed Care – PPO | Admitting: General Practice

## 2013-02-23 ENCOUNTER — Ambulatory Visit: Payer: BC Managed Care – PPO | Admitting: General Practice

## 2013-03-29 ENCOUNTER — Ambulatory Visit: Payer: BC Managed Care – PPO | Admitting: General Practice

## 2013-04-16 ENCOUNTER — Encounter: Payer: Self-pay | Admitting: General Practice

## 2013-04-16 ENCOUNTER — Ambulatory Visit (INDEPENDENT_AMBULATORY_CARE_PROVIDER_SITE_OTHER): Payer: BC Managed Care – PPO | Admitting: General Practice

## 2013-04-16 VITALS — BP 124/82 | HR 107 | Temp 98.8°F | Ht 60.0 in | Wt 170.0 lb

## 2013-04-16 DIAGNOSIS — K649 Unspecified hemorrhoids: Secondary | ICD-10-CM

## 2013-04-16 MED ORDER — HYDROCORTISONE 2.5 % RE CREA: 1.0000 "application " | TOPICAL_CREAM | Freq: Two times a day (BID) | RECTAL | Status: DC

## 2013-04-16 NOTE — Patient Instructions (Signed)
Hemorrhoids Hemorrhoids are swollen veins around the rectum or anus. There are two types of hemorrhoids:   Internal hemorrhoids. These occur in the veins just inside the rectum. They may poke through to the outside and become irritated and painful.  External hemorrhoids. These occur in the veins outside the anus and can be felt as a painful swelling or hard lump near the anus. CAUSES  Pregnancy.   Obesity.   Constipation or diarrhea.   Straining to have a bowel movement.   Sitting for long periods on the toilet.  Heavy lifting or other activity that caused you to strain.  Anal intercourse. SYMPTOMS   Pain.   Anal itching or irritation.   Rectal bleeding.   Fecal leakage.   Anal swelling.   One or more lumps around the anus.  DIAGNOSIS  Your caregiver may be able to diagnose hemorrhoids by visual examination. Other examinations or tests that may be performed include:   Examination of the rectal area with a gloved hand (digital rectal exam).   Examination of anal canal using a small tube (scope).   A blood test if you have lost a significant amount of blood.  A test to look inside the colon (sigmoidoscopy or colonoscopy). TREATMENT Most hemorrhoids can be treated at home. However, if symptoms do not seem to be getting better or if you have a lot of rectal bleeding, your caregiver may perform a procedure to help make the hemorrhoids get smaller or remove them completely. Possible treatments include:   Placing a rubber band at the base of the hemorrhoid to cut off the circulation (rubber band ligation).   Injecting a chemical to shrink the hemorrhoid (sclerotherapy).   Using a tool to burn the hemorrhoid (infrared light therapy).   Surgically removing the hemorrhoid (hemorrhoidectomy).   Stapling the hemorrhoid to block blood flow to the tissue (hemorrhoid stapling).  HOME CARE INSTRUCTIONS   Eat foods with fiber, such as whole grains, beans,  nuts, fruits, and vegetables. Ask your doctor about taking products with added fiber in them (fibersupplements).  Increase fluid intake. Drink enough water and fluids to keep your urine clear or pale yellow.   Exercise regularly.   Go to the bathroom when you have the urge to have a bowel movement. Do not wait.   Avoid straining to have bowel movements.   Keep the anal area dry and clean. Use wet toilet paper or moist towelettes after a bowel movement.   Medicated creams and suppositories may be used or applied as directed.   Only take over-the-counter or prescription medicines as directed by your caregiver.   Take warm sitz baths for 15 20 minutes, 3 4 times a day to ease pain and discomfort.   Place ice packs on the hemorrhoids if they are tender and swollen. Using ice packs between sitz baths may be helpful.   Put ice in a plastic bag.   Place a towel between your skin and the bag.   Leave the ice on for 15 20 minutes, 3 4 times a day.   Do not use a donut-shaped pillow or sit on the toilet for long periods. This increases blood pooling and pain.  SEEK MEDICAL CARE IF:  You have increasing pain and swelling that is not controlled by treatment or medicine.  You have uncontrolled bleeding.  You have difficulty or you are unable to have a bowel movement.  You have pain or inflammation outside the area of the hemorrhoids. MAKE SURE YOU:    Understand these instructions.  Will watch your condition.  Will get help right away if you are not doing well or get worse. Document Released: 05/07/2000 Document Revised: 04/26/2012 Document Reviewed: 03/14/2012 [employer] Patient Information 2014 [employer].  

## 2013-04-16 NOTE — Progress Notes (Signed)
  Subjective:    Patient ID: Yvonne [name], female    DOB: Sep 28, 1990, 22 y.o.   MRN: [id number]  HPI Patient presents today with complaints of hemorrhoids. She reports onset was two days ago after having a bowel movement of hard stool. She denies having hemorrhoids in the past. Denies any rectal bleeding, only rectal pain.  Reports increasing her fluids and fiber, denies any more hard stools. Reports rectal area is tender while in sitting position.    Review of Systems  Constitutional: Negative for fever and chills.  Respiratory: Negative for chest tightness and shortness of breath.   Cardiovascular: Negative for chest pain and palpitations.  Gastrointestinal: Positive for rectal pain. Negative for blood in stool and anal bleeding.       Reports hemorrhoids       Objective:   Physical Exam  Constitutional: She is oriented to person, place, and time. She appears well-developed and well-nourished.  Cardiovascular: Normal rate, regular rhythm and normal heart sounds.   Pulmonary/Chest: Effort normal and breath sounds normal. No respiratory distress. She exhibits no tenderness.  Abdominal: Soft. Bowel sounds are normal. She exhibits no distension. There is no tenderness.  Neurological: She is alert and oriented to person, place, and time.  Skin: Skin is warm and dry.  Psychiatric: She has a normal mood and affect.          Assessment & Plan:  1. Hemorrhoid - hydrocortisone (ANUSOL-HC) 2.5 % rectal cream; Place 1 application rectally 2 (two) times daily.  Dispense: 30 g; Refill: 0 -information sheets provided and discussed on hemorrhoids and care of RTO if symptoms worsen or unresolved -may refer to GI Patient verbalized understanding Coralie Keens, FNP-C

## 2013-04-23 ENCOUNTER — Ambulatory Visit: Payer: BC Managed Care – PPO | Admitting: General Practice

## 2013-11-26 ENCOUNTER — Ambulatory Visit: Payer: BC Managed Care – PPO | Admitting: Nurse Practitioner

## 2013-11-28 ENCOUNTER — Ambulatory Visit (INDEPENDENT_AMBULATORY_CARE_PROVIDER_SITE_OTHER): Payer: BC Managed Care – PPO | Admitting: Family Medicine

## 2013-11-28 VITALS — BP 128/78 | HR 98 | Temp 99.1°F | Ht 60.0 in | Wt 180.8 lb

## 2013-11-28 DIAGNOSIS — W57XXXA Bitten or stung by nonvenomous insect and other nonvenomous arthropods, initial encounter: Secondary | ICD-10-CM

## 2013-11-28 DIAGNOSIS — R52 Pain, unspecified: Secondary | ICD-10-CM

## 2013-11-28 DIAGNOSIS — T148 Other injury of unspecified body region: Secondary | ICD-10-CM

## 2013-11-28 MED ORDER — KETOROLAC TROMETHAMINE 30 MG/ML IJ SOLN: 30.0000 mg | Freq: Once | INTRAMUSCULAR | Status: DC

## 2013-11-28 MED ORDER — DOXYCYCLINE HYCLATE 100 MG PO TABS: 100.0000 mg | ORAL_TABLET | Freq: Two times a day (BID) | ORAL | Status: DC

## 2013-11-28 MED ORDER — KETOROLAC TROMETHAMINE 30 MG/ML IJ SOLN: 30.0000 mg | Freq: Once | INTRAMUSCULAR | Status: AC

## 2013-11-28 MED ORDER — NAPROXEN 500 MG PO TABS: ORAL_TABLET | ORAL | Status: DC

## 2013-11-28 MED ORDER — KETOROLAC TROMETHAMINE 30 MG/ML IJ SOLN
30.0000 mg | Freq: Once | INTRAMUSCULAR | Status: AC
  Administered 2013-11-28: 30 mg via INTRAMUSCULAR

## 2013-11-28 NOTE — Progress Notes (Signed)
   Subjective:    Patient ID: Hubert AzureKasey Robinson, female    DOB: 01-22-1991, 23 y.o.   MRN: [id number]  HPI This 23 y.o. female presents for evaluation of right wrist pain since this am from a bug bite on Her right wrist.  She is crying and c/o severe pain.   Review of Systems C/o insect bite right wrist No chest pain, SOB, HA, dizziness, vision change, N/V, diarrhea, constipation, dysuria, urinary urgency or frequency, myalgias, arthralgias or rash.     Objective:   Physical Exam  Right wrist with 2 erythematous area approx 1/2 cm with central opening and TTP. Right radial pulse intact and capillary refill <2 seconds on right fingers.      Assessment & Plan:  Pain - Plan: ketorolac (TORADOL) 30 MG/ML injection 30 mg, ketorolac (TORADOL) 30 MG/ML injection 30 mg, doxycycline (VIBRA-TABS) 100 MG tablet, naproxen (NAPROSYN) 500 MG tablet, DISCONTINUED: ketorolac (TORADOL) 30 MG/ML injection 30 mg  Insect bite - Plan: ketorolac (TORADOL) 30 MG/ML injection 30 mg, doxycycline (VIBRA-TABS) 100 MG tablet, naproxen (NAPROSYN) 500 MG tablet  Deatra CanterWilliam J Icarus Partch FNP

## 2014-03-27 ENCOUNTER — Encounter: Payer: Self-pay | Admitting: Nurse Practitioner

## 2014-03-27 ENCOUNTER — Ambulatory Visit (INDEPENDENT_AMBULATORY_CARE_PROVIDER_SITE_OTHER): Payer: BC Managed Care – PPO | Admitting: Nurse Practitioner

## 2014-03-27 VITALS — BP 117/78 | HR 80 | Temp 98.4°F | Ht 60.0 in | Wt 179.6 lb

## 2014-03-27 DIAGNOSIS — F909 Attention-deficit hyperactivity disorder, unspecified type: Secondary | ICD-10-CM | POA: Insufficient documentation

## 2014-03-27 MED ORDER — LISDEXAMFETAMINE DIMESYLATE 50 MG PO CAPS: 50.0000 mg | ORAL_CAPSULE | Freq: Every day | ORAL | Status: DC

## 2014-03-27 NOTE — Patient Instructions (Signed)

## 2014-03-27 NOTE — Progress Notes (Signed)
   Subjective:    Patient ID: Yvonne [name], female    DOB: 01-May-1991, 23 y.o.   MRN: [id number]  HPI   Patient is here for ADHD follow up. She used to take vyvanse and stop taking it. She is not currently on any medication. She reports she cant focus to complete any task and will be returning to school soon. She talks very fast.  *noticed this when she was young but mom would not let her take meds.    Review of Systems  All other systems reviewed and are negative.      Objective:   Physical Exam  Constitutional: She is oriented to person, place, and time. She appears well-developed and well-nourished.  HENT:  Head: Normocephalic.  Eyes: Conjunctivae are normal. Pupils are equal, round, and reactive to light.  Neck: Normal range of motion.  Cardiovascular: Normal rate and regular rhythm.   Pulmonary/Chest: Effort normal and breath sounds normal. No respiratory distress. She has no wheezes. She has no rales. She exhibits no tenderness.  Abdominal: Soft.  Musculoskeletal: Normal range of motion.  Neurological: She is alert and oriented to person, place, and time. She has normal reflexes.  Skin: Skin is warm.  Psychiatric: She has a normal mood and affect. Her behavior is normal. Judgment and thought content normal.   BP 117/78 mmHg  Pulse 80  Temp(Src) 98.4 F (36.9 C) (Oral)  Ht 5' (1.524 m)  Wt 179 lb 9.6 oz (81.466 kg)  BMI 35.08 kg/m2        Assessment & Plan:   1. Attention deficit hyperactivity disorder (ADHD), unspecified ADHD type    Meds ordered this encounter  Medications  . lisdexamfetamine (VYVANSE) 50 MG capsule    Sig: Take 1 capsule (50 mg total) by mouth daily.    Dispense:  30 capsule    Refill:  0    Order Specific Question:  Supervising Provider    Answer:  Deborra MedinaMOORE, DONALD W [[id number]]   Meds as prescribed Behavior modification as needed Follow-up for recheck in 1 months   Mary-Margaret Daphine DeutscherMartin, FNP

## 2014-05-06 ENCOUNTER — Ambulatory Visit: Payer: BC Managed Care – PPO | Admitting: Nurse Practitioner

## 2014-05-22 ENCOUNTER — Ambulatory Visit (INDEPENDENT_AMBULATORY_CARE_PROVIDER_SITE_OTHER): Payer: BC Managed Care – PPO | Admitting: Family Medicine

## 2014-05-22 ENCOUNTER — Encounter: Payer: Self-pay | Admitting: Family Medicine

## 2014-05-22 VITALS — BP 115/74 | HR 99 | Temp 98.9°F | Ht 60.0 in | Wt 179.2 lb

## 2014-05-22 DIAGNOSIS — J069 Acute upper respiratory infection, unspecified: Secondary | ICD-10-CM

## 2014-05-22 MED ORDER — AZITHROMYCIN 250 MG PO TABS: ORAL_TABLET | ORAL | Status: DC

## 2014-05-22 NOTE — Progress Notes (Signed)
   Subjective:    Patient ID: Yvonne [name], female    DOB: March 28, 1991, 23 y.o.   MRN: [id number]  HPI Patient is here c/o uri sx's.  She is [redacted] weeks pregnant.  She called OBGYN yesterday and was advised to take children's cough medicine according to patient.  She states this is not helping and was advised to see her PCP and she will follow up with OBGYN tomorrow.  Review of Systems  Constitutional: Negative for fever.  HENT: Negative for ear pain.   Eyes: Negative for discharge.  Respiratory: Negative for cough.   Cardiovascular: Negative for chest pain.  Gastrointestinal: Negative for abdominal distention.  Endocrine: Negative for polyuria.  Genitourinary: Negative for difficulty urinating.  Musculoskeletal: Negative for gait problem and neck pain.  Skin: Negative for color change and rash.  Neurological: Negative for speech difficulty and headaches.  Psychiatric/Behavioral: Negative for agitation.       Objective:    BP 115/74 mmHg  Pulse 99  Temp(Src) 98.9 F (37.2 C) (Oral)  Ht 5' (1.524 m)  Wt 179 lb 3.2 oz (81.285 kg)  BMI 35.00 kg/m2  LMP 11/21/2013 Physical Exam  Constitutional: She is oriented to person, place, and time. She appears well-developed and well-nourished.  HENT:  Head: Normocephalic and atraumatic.  Mouth/Throat: Oropharynx is clear and moist.  Eyes: Pupils are equal, round, and reactive to light.  Neck: Normal range of motion. Neck supple.  Cardiovascular: Normal rate and regular rhythm.   No murmur heard. Pulmonary/Chest: Effort normal and breath sounds normal.  Abdominal: Soft. Bowel sounds are normal. There is no tenderness.  Neurological: She is alert and oriented to person, place, and time.  Skin: Skin is warm and dry.  Psychiatric: She has a normal mood and affect.          Assessment & Plan:     ICD-9-CM ICD-10-CM   1. URI (upper respiratory infection) 465.9 J06.9    zpak as directed  Follow up with OBGYN  Return if symptoms  worsen or fail to improve.  Deatra CanterWilliam J Asmi Fugere FNP

## 2014-05-29 LAB — OB RESULTS CONSOLE ANTIBODY SCREEN: ANTIBODY SCREEN: NEGATIVE

## 2014-05-29 LAB — OB RESULTS CONSOLE ABO/RH: RH Type: POSITIVE

## 2014-05-29 LAB — OB RESULTS CONSOLE RPR: RPR: NONREACTIVE

## 2014-05-29 LAB — OB RESULTS CONSOLE HEPATITIS B SURFACE ANTIGEN: Hepatitis B Surface Ag: NEGATIVE

## 2014-05-29 LAB — OB RESULTS CONSOLE HIV ANTIBODY (ROUTINE TESTING): HIV: NONREACTIVE

## 2014-05-29 LAB — OB RESULTS CONSOLE RUBELLA ANTIBODY, IGM: Rubella: IMMUNE

## 2014-06-10 ENCOUNTER — Other Ambulatory Visit: Payer: Self-pay | Admitting: Obstetrics and Gynecology

## 2014-06-10 LAB — OB RESULTS CONSOLE GC/CHLAMYDIA
CHLAMYDIA, DNA PROBE: NEGATIVE
Gonorrhea: NEGATIVE

## 2014-06-11 LAB — CYTOLOGY - PAP

## 2014-08-02 ENCOUNTER — Inpatient Hospital Stay (HOSPITAL_COMMUNITY)
Admission: AD | Admit: 2014-08-02 | Discharge: 2014-08-02 | Disposition: A | Discharge status: Home / Self Care | Payer: BLUE CROSS/BLUE SHIELD | Source: Ambulatory Visit | Attending: Obstetrics and Gynecology | Admitting: Obstetrics and Gynecology

## 2014-08-02 ENCOUNTER — Encounter (HOSPITAL_COMMUNITY): Payer: Self-pay

## 2014-08-02 DIAGNOSIS — O4290 Premature rupture of membranes, unspecified as to length of time between rupture and onset of labor, unspecified weeks of gestation: Secondary | ICD-10-CM | POA: Diagnosis not present

## 2014-08-02 DIAGNOSIS — N898 Other specified noninflammatory disorders of vagina: Secondary | ICD-10-CM | POA: Diagnosis present

## 2014-08-02 DIAGNOSIS — O9989 Other specified diseases and conditions complicating pregnancy, childbirth and the puerperium: Secondary | ICD-10-CM | POA: Insufficient documentation

## 2014-08-02 DIAGNOSIS — R51 Headache: Secondary | ICD-10-CM | POA: Insufficient documentation

## 2014-08-02 DIAGNOSIS — Z3A18 18 weeks gestation of pregnancy: Secondary | ICD-10-CM | POA: Diagnosis not present

## 2014-08-02 LAB — URINALYSIS, ROUTINE W REFLEX MICROSCOPIC
BILIRUBIN URINE: NEGATIVE
Glucose, UA: NEGATIVE mg/dL
HGB URINE DIPSTICK: NEGATIVE
KETONES UR: 15 mg/dL — AB
LEUKOCYTES UA: NEGATIVE
Nitrite: NEGATIVE
PH: 7 (ref 5.0–8.0)
Protein, ur: NEGATIVE mg/dL
SPECIFIC GRAVITY, URINE: 1.02 (ref 1.005–1.030)
Urobilinogen, UA: 0.2 mg/dL (ref 0.0–1.0)

## 2014-08-02 LAB — WET PREP, GENITAL
Clue Cells Wet Prep HPF POC: NONE SEEN
Trich, Wet Prep: NONE SEEN
YEAST WET PREP: NONE SEEN

## 2014-08-02 NOTE — Discharge Instructions (Signed)

## 2014-08-02 NOTE — MAU Note (Signed)
Pt presents complaining of leaking of fluid. First noticed leaking a week ago and today had a gush that "felt like water." States she has had to change her pants twice because of the leaking. Pt also has a headache and has vomited today. Pt also having pain in stomach.

## 2014-08-02 NOTE — MAU Provider Note (Signed)
History     CSN: [id number]  Arrival date and time: 08/02/14 1438   None     Chief Complaint  Patient presents with  . Vaginal Discharge  . Headache   HPI  24 y.o. G2P1001 @[redacted]w[redacted]d  presents to the MAU with the complaint leaking of fluid. First noticed leaking a week ago and today had a gush that "felt like water." States she has had to change her pants twice because of the leaking. Pt also has a headache and has vomited today. Pt also having pain in stomach.    Past Medical History  Diagnosis Date  . No pertinent past medical history   . Gestational hypertension     Past Surgical History  Procedure Laterality Date  . Appendectomy    . Cesarean section  03/25/2012    Procedure: CESAREAN SECTION;  Surgeon: Leslie AndreaJames E Tomblin II, MD;  Location: [facility];  Service: Obstetrics;  Laterality: N/A;  Primary Cesarean Section Delivery Boy @ [phone], Apgars 7/9    Family History  Problem Relation Age of Onset  . Diabetes Mother   . Hypertension Mother   . Hypertension Father   . Heart disease Father   . Diabetes Maternal Grandmother   . Diabetes Maternal Grandfather   . Hypertension Paternal Grandfather     History  Substance Use Topics  . Smoking status: Never Smoker   . Smokeless tobacco: Not on file  . Alcohol Use: No    Allergies: No Known Allergies  Prescriptions prior to admission  Medication Sig Dispense Refill Last Dose  . azithromycin (ZITHROMAX) 250 MG tablet Take 2 today and then 1 tablet day 2-5 6 tablet 0     Review of Systems  Constitutional: Negative.   HENT: Negative.   Eyes: Negative.   Respiratory: Negative for shortness of breath.   Cardiovascular: Negative.   Gastrointestinal: Positive for nausea and vomiting. Negative for abdominal pain.  Genitourinary: Negative.        Thinks her water broke  Musculoskeletal: Negative.   Skin: Positive for rash.       forearms  Neurological: Negative.   Endo/Heme/Allergies: Negative.   Psychiatric/Behavioral:  Negative.    Physical Exam   Blood pressure 131/77, pulse 107, temperature 98.9 F (37.2 C), temperature source Oral, resp. rate 18, height 4\' 11"  (1.499 m), weight 83.28 kg (183 lb 9.6 oz), last menstrual period 03/23/2014, unknown if currently breastfeeding.  Physical Exam  Constitutional: She is oriented to person, place, and time. She appears well-developed and well-nourished. No distress.  HENT:  Head: Normocephalic and atraumatic.  Neck: Normal range of motion.  Cardiovascular: Normal rate.   Respiratory: Effort normal. No respiratory distress.  GI: Soft. She exhibits no distension and no mass. There is no tenderness. There is no rebound and no guarding.  Genitourinary: There is no rash, tenderness, lesion or injury on the right labia. There is no rash, tenderness, lesion or injury on the left labia. Uterus is enlarged. Cervix exhibits no motion tenderness, no discharge and no friability. There is erythema in the vagina. No tenderness or bleeding in the vagina. No foreign body around the vagina. No signs of injury around the vagina. Vaginal discharge found.  White thick discharge. No pooling evident  Musculoskeletal: Normal range of motion.  Neurological: She is alert and oriented to person, place, and time.  Skin: Skin is warm and dry.  Psychiatric: She has a normal mood and affect. Her behavior is normal. Judgment and thought content normal.  Cervix- closed thick high FHT's 150's MAU Course  Procedures  Wet prep Fern Test Negative Results for orders placed or performed during the hospital encounter of 08/02/14 (from the past 24 hour(s))  Urinalysis, Routine w reflex microscopic     Status: Abnormal   Collection Time: 08/02/14  2:49 PM  Result Value Ref Range   Color, Urine YELLOW YELLOW   APPearance CLEAR CLEAR   Specific Gravity, Urine 1.020 1.005 - 1.030   pH 7.0 5.0 - 8.0   Glucose, UA NEGATIVE NEGATIVE mg/dL   Hgb urine dipstick NEGATIVE NEGATIVE   Bilirubin  Urine NEGATIVE NEGATIVE   Ketones, ur 15 (A) NEGATIVE mg/dL   Protein, ur NEGATIVE NEGATIVE mg/dL   Urobilinogen, UA 0.2 0.0 - 1.0 mg/dL   Nitrite NEGATIVE NEGATIVE   Leukocytes, UA NEGATIVE NEGATIVE  Wet prep, genital     Status: Abnormal   Collection Time: 08/02/14  4:10 PM  Result Value Ref Range   Yeast Wet Prep HPF POC NONE SEEN NONE SEEN   Trich, Wet Prep NONE SEEN NONE SEEN   Clue Cells Wet Prep HPF POC NONE SEEN NONE SEEN   WBC, Wet Prep HPF POC FEW (A) NONE SEEN     Assessment and Plan  R/O Ruptured membranes Reported labs and exam to Dr Adalberto Ill Pt will be discharged to home and is agreeable to POC.  Clemmons,Lori Grissett 08/02/2014, 3:22 PM

## 2014-08-02 NOTE — MAU Note (Signed)
Pt stated rash appeared on both forearms and face after gush of fluids that "felt like water" and vomiting this morning around 1100.

## 2014-08-05 LAB — GC/CHLAMYDIA PROBE AMP (~~LOC~~) NOT AT ARMC
Chlamydia: NEGATIVE
Neisseria Gonorrhea: NEGATIVE

## 2014-11-16 ENCOUNTER — Encounter (HOSPITAL_COMMUNITY): Payer: Self-pay

## 2014-11-16 ENCOUNTER — Inpatient Hospital Stay (HOSPITAL_COMMUNITY): Payer: BLUE CROSS/BLUE SHIELD

## 2014-11-16 ENCOUNTER — Inpatient Hospital Stay (HOSPITAL_COMMUNITY)
Admission: AD | Admit: 2014-11-16 | Discharge: 2014-11-17 | Disposition: A | Discharge status: Home / Self Care | Payer: BLUE CROSS/BLUE SHIELD | Source: Ambulatory Visit | Attending: Obstetrics and Gynecology | Admitting: Obstetrics and Gynecology

## 2014-11-16 DIAGNOSIS — O9989 Other specified diseases and conditions complicating pregnancy, childbirth and the puerperium: Secondary | ICD-10-CM | POA: Diagnosis not present

## 2014-11-16 DIAGNOSIS — R1011 Right upper quadrant pain: Secondary | ICD-10-CM | POA: Diagnosis present

## 2014-11-16 DIAGNOSIS — R1013 Epigastric pain: Secondary | ICD-10-CM

## 2014-11-16 DIAGNOSIS — O26899 Other specified pregnancy related conditions, unspecified trimester: Secondary | ICD-10-CM

## 2014-11-16 DIAGNOSIS — R109 Unspecified abdominal pain: Secondary | ICD-10-CM

## 2014-11-16 DIAGNOSIS — Z3A34 34 weeks gestation of pregnancy: Secondary | ICD-10-CM | POA: Diagnosis not present

## 2014-11-16 HISTORY — DX: Headache, unspecified: R51.9

## 2014-11-16 HISTORY — DX: Headache: R51

## 2014-11-16 LAB — CBC WITH DIFFERENTIAL/PLATELET
BASOS ABS: 0 10*3/uL (ref 0.0–0.1)
Basophils Relative: 0 % (ref 0–1)
Eosinophils Absolute: 0.2 10*3/uL (ref 0.0–0.7)
Eosinophils Relative: 1 % (ref 0–5)
HEMATOCRIT: 32.6 % — AB (ref 36.0–46.0)
HEMOGLOBIN: 10.7 g/dL — AB (ref 12.0–15.0)
LYMPHS ABS: 2.2 10*3/uL (ref 0.7–4.0)
LYMPHS PCT: 17 % (ref 12–46)
MCH: 27.2 pg (ref 26.0–34.0)
MCHC: 32.8 g/dL (ref 30.0–36.0)
MCV: 82.7 fL (ref 78.0–100.0)
Monocytes Absolute: 0.9 10*3/uL (ref 0.1–1.0)
Monocytes Relative: 7 % (ref 3–12)
NEUTROS PCT: 75 % (ref 43–77)
Neutro Abs: 9.6 10*3/uL — ABNORMAL HIGH (ref 1.7–7.7)
PLATELETS: 217 10*3/uL (ref 150–400)
RBC: 3.94 MIL/uL (ref 3.87–5.11)
RDW: 13.2 % (ref 11.5–15.5)
WBC: 12.9 10*3/uL — AB (ref 4.0–10.5)

## 2014-11-16 LAB — COMPREHENSIVE METABOLIC PANEL
ALT: 11 U/L — AB (ref 14–54)
ANION GAP: 7 (ref 5–15)
AST: 11 U/L — ABNORMAL LOW (ref 15–41)
Albumin: 2.7 g/dL — ABNORMAL LOW (ref 3.5–5.0)
Alkaline Phosphatase: 79 U/L (ref 38–126)
BUN: 8 mg/dL (ref 6–20)
CO2: 23 mmol/L (ref 22–32)
Calcium: 8.5 mg/dL — ABNORMAL LOW (ref 8.9–10.3)
Chloride: 105 mmol/L (ref 101–111)
Creatinine, Ser: 0.56 mg/dL (ref 0.44–1.00)
GFR calc non Af Amer: >60 mL/min (ref >60)
Glucose, Bld: 88 mg/dL (ref 65–99)
Potassium: 3.5 mmol/L (ref 3.5–5.1)
Sodium: 135 mmol/L (ref 135–145)
Total Bilirubin: 0.3 mg/dL (ref 0.3–1.2)
Total Protein: 5.9 g/dL — ABNORMAL LOW (ref 6.5–8.1)

## 2014-11-16 LAB — URINALYSIS, ROUTINE W REFLEX MICROSCOPIC
BILIRUBIN URINE: NEGATIVE
GLUCOSE, UA: NEGATIVE mg/dL
HGB URINE DIPSTICK: NEGATIVE
KETONES UR: NEGATIVE mg/dL
Leukocytes, UA: NEGATIVE
NITRITE: NEGATIVE
Protein, ur: NEGATIVE mg/dL
SPECIFIC GRAVITY, URINE: 1.015 (ref 1.005–1.030)
UROBILINOGEN UA: 0.2 mg/dL (ref 0.0–1.0)
pH: 6.5 (ref 5.0–8.0)

## 2014-11-16 LAB — AMYLASE: Amylase: 85 U/L (ref 28–100)

## 2014-11-16 LAB — LIPASE, BLOOD: LIPASE: 22 U/L (ref 22–51)

## 2014-11-16 MED ORDER — GI COCKTAIL ~~LOC~~
30.0000 mL | Freq: Once | ORAL | Status: AC
  Administered 2014-11-16: 30 mL via ORAL
  Filled 2014-11-16: qty 30

## 2014-11-16 NOTE — MAU Note (Signed)
Pt c/o upper abd pain and back pain q 10 min . Not sure if they are contractions. Good fetal movement reported. Denies any vag bleeding or leaking.

## 2014-11-16 NOTE — MAU Provider Note (Signed)
History     CSN: [id number]  Arrival date and time: 11/16/14 2105   None     Chief Complaint  Patient presents with  . Abdominal Pain   This is a 24 y.o. female at [redacted]w[redacted]d who presents with c/o Upper abdominal pain which starts in the Right upper quadrant and spreads to the left, with some pain shooting through to her back. Pain is dull and sharp.  This started tonight. States has never had this pain before. Denies nausea or vomiting Denies fever. Denies contractions. Reports + fetal movement. Denies leaking or bleeding.   Abdominal Pain This is a new problem. The current episode started today. The onset quality is gradual. The problem occurs constantly. The problem has been unchanged. The pain is located in the LUQ, RUQ and epigastric region. The quality of the pain is dull and sharp. The abdominal pain radiates to the back. Pertinent negatives include no belching, constipation, diarrhea, dysuria, fever, nausea or vomiting. Nothing aggravates the pain. The pain is relieved by nothing. She has tried nothing for the symptoms.    RN Note:  Expand All Collapse All   Pt c/o upper abd pain and back pain q 10 min . Not sure if they are contractions. Good fetal movement reported. Denies any vag bleeding or leaking.        OB History    This patient's OB History needs to be verified. Open OB History to review and resolve any issues.   Gravida Para Term Preterm AB TAB SAB Ectopic Multiple Living   '2 1 1      1 1      '$ Obstetric Comments   Previous pregnancy induced at 37 weeks, with placental abruption.       Past Medical History  Diagnosis Date  . No pertinent past medical history   . Gestational hypertension   . Headache     Past Surgical History  Procedure Laterality Date  . Appendectomy    . Cesarean section  03/25/2012    Procedure: CESAREAN SECTION;  Surgeon: Allena Katz, MD;  Location: [facility];  Service: Obstetrics;  Laterality: N/A;  Primary Cesarean Section Delivery  Boy @ [phone], Apgars 7/9    Family History  Problem Relation Age of Onset  . Diabetes Mother   . Hypertension Mother   . Hypertension Father   . Heart disease Father   . Diabetes Maternal Grandmother   . Diabetes Maternal Grandfather   . Hypertension Paternal Grandfather     History  Substance Use Topics  . Smoking status: Never Smoker   . Smokeless tobacco: Never Used  . Alcohol Use: No    Allergies: No Known Allergies  Prescriptions prior to admission  Medication Sig Dispense Refill Last Dose  . Prenatal Vit-Fe Fumarate-FA (PRENATAL MULTIVITAMIN) TABS tablet Take 1 tablet by mouth daily at 12 noon.   11/16/2014 at Unknown time    Review of Systems  Constitutional: Negative for fever, chills and malaise/fatigue.  Respiratory: Negative for shortness of breath.   Gastrointestinal: Positive for abdominal pain. Negative for nausea, vomiting, diarrhea and constipation.  Genitourinary: Negative for dysuria and flank pain.  Musculoskeletal: Positive for back pain.  Neurological: Negative for dizziness and weakness.   Physical Exam   Blood pressure 113/76, pulse 101, temperature 98.1 F (36.7 C), temperature source Oral, resp. rate 18, height $RemoveBe'4\' 11"'GIKkYIyYv$  (1.499 m), weight 192 lb 9.6 oz (87.363 kg), last menstrual period 03/23/2014, unknown if currently breastfeeding.  Physical Exam  Constitutional: She is oriented to person, place, and time. She appears well-developed and well-nourished. No distress.  HENT:  Head: Normocephalic.  Cardiovascular: Normal rate and regular rhythm.  Exam reveals no gallop and no friction rub.   No murmur heard. Respiratory: Effort normal and breath sounds normal. No respiratory distress.  GI: Soft. She exhibits no distension and no mass (exam limited by gravid uterus). There is tenderness (mild to mod tenderness over RUQ and epigastrum). There is no rebound and no guarding.  Genitourinary:  Fetal heart rate reactive No contractions   Musculoskeletal:  Normal range of motion.  Neurological: She is alert and oriented to person, place, and time.  Skin: Skin is warm and dry.  Psychiatric: She has a normal mood and affect.    MAU Course  Procedures  MDM Consulted Dr Matthew Saras regarding presenting complaint, exam, and fetal heart rate/contraction tracing.  He agrees to getting an gallbladder ultrasound, lab studies, and limited OB US.  After GB US, will try dose of GI cocktail to rule out gastric origin of pain.   Results for orders placed or performed during the hospital encounter of 11/16/14 (from the past 72 hour(s))  Urinalysis, Routine w reflex microscopic (not at [facility])     Status: None   Collection Time: 11/16/14  9:20 PM  Result Value Ref Range   Color, Urine YELLOW YELLOW   APPearance CLEAR CLEAR   Specific Gravity, Urine 1.015 1.005 - 1.030   pH 6.5 5.0 - 8.0   Glucose, UA NEGATIVE NEGATIVE mg/dL   Hgb urine dipstick NEGATIVE NEGATIVE   Bilirubin Urine NEGATIVE NEGATIVE   Ketones, ur NEGATIVE NEGATIVE mg/dL   Protein, ur NEGATIVE NEGATIVE mg/dL   Urobilinogen, UA 0.2 0.0 - 1.0 mg/dL   Nitrite NEGATIVE NEGATIVE   Leukocytes, UA NEGATIVE NEGATIVE    Comment: MICROSCOPIC NOT DONE ON URINES WITH NEGATIVE PROTEIN, BLOOD, LEUKOCYTES, NITRITE, OR GLUCOSE <1000 mg/dL.  CBC with Differential     Status: Abnormal   Collection Time: 11/16/14  9:55 PM  Result Value Ref Range   WBC 12.9 (H) 4.0 - 10.5 K/uL   RBC 3.94 3.87 - 5.11 MIL/uL   Hemoglobin 10.7 (L) 12.0 - 15.0 g/dL   HCT 32.6 (L) 36.0 - 46.0 %   MCV 82.7 78.0 - 100.0 fL   MCH 27.2 26.0 - 34.0 pg   MCHC 32.8 30.0 - 36.0 g/dL   RDW 13.2 11.5 - 15.5 %   Platelets 217 150 - 400 K/uL   Neutrophils Relative % 75 43 - 77 %   Neutro Abs 9.6 (H) 1.7 - 7.7 K/uL   Lymphocytes Relative 17 12 - 46 %   Lymphs Abs 2.2 0.7 - 4.0 K/uL   Monocytes Relative 7 3 - 12 %   Monocytes Absolute 0.9 0.1 - 1.0 K/uL   Eosinophils Relative 1 0 - 5 %   Eosinophils Absolute 0.2 0.0 - 0.7 K/uL    Basophils Relative 0 0 - 1 %   Basophils Absolute 0.0 0.0 - 0.1 K/uL  Comprehensive metabolic panel     Status: Abnormal   Collection Time: 11/16/14  9:55 PM  Result Value Ref Range   Sodium 135 135 - 145 mmol/L   Potassium 3.5 3.5 - 5.1 mmol/L   Chloride 105 101 - 111 mmol/L   CO2 23 22 - 32 mmol/L   Glucose, Bld 88 65 - 99 mg/dL   BUN 8 6 - 20 mg/dL   Creatinine, Ser 0.56 0.44 - 1.00  mg/dL   Calcium 8.5 (L) 8.9 - 10.3 mg/dL   Total Protein 5.9 (L) 6.5 - 8.1 g/dL   Albumin 2.7 (L) 3.5 - 5.0 g/dL   AST 11 (L) 15 - 41 U/L   ALT 11 (L) 14 - 54 U/L   Alkaline Phosphatase 79 38 - 126 U/L   Total Bilirubin 0.3 0.3 - 1.2 mg/dL   GFR calc non Af Amer >60 >60 mL/min   GFR calc Af Amer >60 >60 mL/min    Comment: (NOTE) The eGFR has been calculated using the CKD EPI equation. This calculation has not been validated in all clinical situations. eGFR's persistently <60 mL/min signify possible Chronic Kidney Disease.    Anion gap 7 5 - 15  Amylase     Status: None   Collection Time: 11/16/14  9:55 PM  Result Value Ref Range   Amylase 85 28 - 100 U/L  Lipase, blood     Status: None   Collection Time: 11/16/14  9:55 PM  Result Value Ref Range   Lipase 22 22 - 51 U/L   US Abdomen Limited Ruq  11/16/2014   CLINICAL DATA:  Right upper quadrant abdominal pain  EXAM: US ABDOMEN LIMITED - RIGHT UPPER QUADRANT  COMPARISON:  None.  FINDINGS: Gallbladder:  Underdistended. No gallstones, gallbladder wall thickening, or pericholecystic fluid. Negative sonographic Murphy's sign.  Common bile duct:  Diameter: 2 mm  Liver:  No focal lesion identified. Within normal limits in parenchymal echogenicity.  IMPRESSION: Negative right upper quadrant ultrasound.   Electronically Signed   By: Julian Hy M.D.   On: 11/16/2014 22:44   OB US showed normal fundal placenta, normal fluid.  GI cocktail given and patient states she has only mild relief of pain. States she is tired and wants to go home  Reviewed  results with Dr Matthew Saras. He states we had a good negative workup for abdominal pain. He does recommend an acid reducing medicine and follow up in office  Assessment and Plan  A:  SIUP at [redacted]w[redacted]d       Upper abdominal pain       Negative exam for cholecystitis       Reassuring fetal status via EFM and Ultrasound       No evidence of preterm labor       Probable gastritis/GERD  P:   Discharge home       Rx Protonix for daily use at home for acid reduction       Warning signs reviewed with patient.        Follow up in office for prenatal care and followup of pain.    [facility] 11/16/2014, 9:40 PM

## 2014-11-17 DIAGNOSIS — R1013 Epigastric pain: Secondary | ICD-10-CM | POA: Diagnosis not present

## 2014-11-17 MED ORDER — PANTOPRAZOLE SODIUM 20 MG PO TBEC: 20.0000 mg | DELAYED_RELEASE_TABLET | Freq: Every day | ORAL | Status: DC

## 2014-11-17 NOTE — Discharge Instructions (Signed)
Abdominal Pain During Pregnancy °Belly (abdominal) pain is common during pregnancy. Most of the time, it is not a serious problem. Other times, it can be a sign that something is wrong with the pregnancy. Always tell your doctor if you have belly pain. °HOME CARE °Monitor your belly pain for any changes. The following actions may help you feel better: °· Do not have sex (intercourse) or put anything in your vagina until you feel better. °· Rest until your pain stops. °· Drink clear fluids if you feel sick to your stomach (nauseous). Do not eat solid food until you feel better. °· Only take medicine as told by your doctor. °· Keep all doctor visits as told. °GET HELP RIGHT AWAY IF:  °· You are bleeding, leaking fluid, or pieces of tissue come out of your vagina. °· You have more pain or cramping. °· You keep throwing up (vomiting). °· You have pain when you pee (urinate) or have blood in your pee. °· You have a fever. °· You do not feel your baby moving as much. °· You feel very weak or feel like passing out. °· You have trouble breathing, with or without belly pain. °· You have a very bad headache and belly pain. °· You have fluid leaking from your vagina and belly pain. °· You keep having watery poop (diarrhea). °· Your belly pain does not go away after resting, or the pain gets worse. °MAKE SURE YOU:  °· Understand these instructions. °· Will watch your condition. °· Will get help right away if you are not doing well or get worse. °Document Released: 04/28/2009 Document Revised: 01/10/2013 Document Reviewed: 12/07/2012 °[employer]® Patient Information ©2015 [employer]. This information is not intended to replace advice given to you by your health care provider. Make sure you discuss any questions you have with your health care provider. ° °

## 2014-11-18 DIAGNOSIS — R109 Unspecified abdominal pain: Secondary | ICD-10-CM

## 2014-11-18 DIAGNOSIS — O26899 Other specified pregnancy related conditions, unspecified trimester: Secondary | ICD-10-CM | POA: Insufficient documentation

## 2014-11-18 DIAGNOSIS — Z3A34 34 weeks gestation of pregnancy: Secondary | ICD-10-CM | POA: Insufficient documentation

## 2014-12-13 ENCOUNTER — Inpatient Hospital Stay (HOSPITAL_COMMUNITY)
Admission: AD | Admit: 2014-12-13 | Discharge: 2014-12-14 | Disposition: A | Discharge status: Home / Self Care | Payer: BLUE CROSS/BLUE SHIELD | Source: Ambulatory Visit | Attending: Obstetrics and Gynecology | Admitting: Obstetrics and Gynecology

## 2014-12-13 ENCOUNTER — Encounter (HOSPITAL_COMMUNITY): Payer: Self-pay | Admitting: *Deleted

## 2014-12-13 DIAGNOSIS — Z3493 Encounter for supervision of normal pregnancy, unspecified, third trimester: Secondary | ICD-10-CM | POA: Insufficient documentation

## 2014-12-13 NOTE — MAU Note (Signed)
Contracting since 2000 tonight. Denies LOF or bleeding. Some mucousy d/c earlier today. For repeat C/S 8/5. Had abruption with first pregnancy and small abruption with this pregnancy but has been resolving

## 2014-12-14 DIAGNOSIS — Z3493 Encounter for supervision of normal pregnancy, unspecified, third trimester: Secondary | ICD-10-CM | POA: Diagnosis not present

## 2014-12-14 MED ORDER — BUTORPHANOL TARTRATE 1 MG/ML IJ SOLN
1.0000 mg | Freq: Once | INTRAMUSCULAR | Status: AC
  Administered 2014-12-14: 1 mg via INTRAMUSCULAR
  Filled 2014-12-14: qty 1

## 2014-12-25 ENCOUNTER — Encounter (HOSPITAL_COMMUNITY)
Admission: RE | Admit: 2014-12-25 | Discharge: 2014-12-25 | Disposition: A | Discharge status: Home / Self Care | Payer: BLUE CROSS/BLUE SHIELD | Source: Ambulatory Visit | Attending: Obstetrics and Gynecology | Admitting: Obstetrics and Gynecology

## 2014-12-25 LAB — CBC
HEMATOCRIT: 36 % (ref 36.0–46.0)
Hemoglobin: 11.4 g/dL — ABNORMAL LOW (ref 12.0–15.0)
MCH: 25.9 pg — ABNORMAL LOW (ref 26.0–34.0)
MCHC: 31.7 g/dL (ref 30.0–36.0)
MCV: 81.6 fL (ref 78.0–100.0)
Platelets: 261 10*3/uL (ref 150–400)
RBC: 4.41 MIL/uL (ref 3.87–5.11)
RDW: 14.3 % (ref 11.5–15.5)
WBC: 11.5 10*3/uL — ABNORMAL HIGH (ref 4.0–10.5)

## 2014-12-25 LAB — TYPE AND SCREEN
ABO/RH(D): O POS
ANTIBODY SCREEN: NEGATIVE

## 2014-12-25 NOTE — Patient Instructions (Addendum)
   Your procedure is scheduled on: AUGUST 5 AT 2PM  Enter through the Main Entrance of [facility] at: 1230PM  Pick up the phone at the desk and dial [phone] and inform us of your arrival.  Please call this number if you have any problems the morning of surgery: [phone]  Remember: Do not eat food after midnight: Aug 4 You are allowed to have clear liquids til 10am day of surgery Take these medicines the morning of surgery with a SIP OF WATER:  Do not wear jewelry, make-up, or FINGER nail polish No metal in your hair or on your body. Do not wear lotions, powders, perfumes.  You may wear deodorant.  Do not bring valuables to the hospital. Contacts, dentures or bridgework may not be worn into surgery.  Leave suitcase in the car. After Surgery it may be brought to your room. For patients being admitted to the hospital, checkout time is 11:00am the day of discharge.

## 2014-12-26 LAB — RPR: RPR Ser Ql: NONREACTIVE

## 2014-12-26 NOTE — Anesthesia Preprocedure Evaluation (Signed)
Anesthesia Evaluation  Patient identified by MRN, date of birth, ID band Patient awake    Reviewed: Allergy & Precautions, NPO status , Patient's Chart, lab work & pertinent test results  History of Anesthesia Complications Negative for: history of anesthetic complications  Airway Mallampati: II  TM Distance: >3 FB Neck ROM: Full    Dental no notable dental hx. (+) Dental Advisory Given   Pulmonary neg pulmonary ROS,  breath sounds clear to auscultation  Pulmonary exam normal       Cardiovascular hypertension (gestational HTN), Normal cardiovascular examRhythm:Regular Rate:Normal     Neuro/Psych  Headaches, PSYCHIATRIC DISORDERS (ADHD)    GI/Hepatic negative GI ROS, Neg liver ROS,   Endo/Other  Morbid obesity  Renal/GU negative Renal ROS  negative genitourinary   Musculoskeletal negative musculoskeletal ROS (+)   Abdominal   Peds negative pediatric ROS (+)  Hematology negative hematology ROS (+)   Anesthesia Other Findings   Reproductive/Obstetrics (+) Pregnancy                             Anesthesia Physical Anesthesia Plan  ASA: III  Anesthesia Plan: Spinal   Post-op Pain Management:    Induction:   Airway Management Planned:   Additional Equipment:   Intra-op Plan:   Post-operative Plan:   Informed Consent: I have reviewed the patients History and Physical, chart, labs and discussed the procedure including the risks, benefits and alternatives for the proposed anesthesia with the patient or authorized representative who has indicated his/her understanding and acceptance.   Dental advisory given  Plan Discussed with: CRNA  Anesthesia Plan Comments:         Anesthesia Quick Evaluation

## 2014-12-26 NOTE — H&P (Signed)
Yvonne [name] is a 24 y.o. female presenting for repeat cesarean section. Maternal Medical History:  Fetal activity: Perceived fetal activity is normal.      OB History    This patient's OB History needs to be verified. Open OB History to review and resolve any issues.   Gravida Para Term Preterm AB TAB SAB Ectopic Multiple Living   Obstetric Comments   Previous pregnancy induced at 37 weeks, with placental abruption.      Past Medical History  Diagnosis Date  . No pertinent past medical history   . Gestational hypertension   . Headache    Past Surgical History  Procedure Laterality Date  . Appendectomy    . Cesarean section  03/25/2012    Procedure: CESAREAN SECTION;  Surgeon: Leslie Andrea, MD;  Location: [facility];  Service: Obstetrics;  Laterality: N/A;  Primary Cesarean Section Delivery Boy @ [phone], Apgars 7/9   Family History: family history includes Diabetes in her maternal grandfather, maternal grandmother, and mother; Heart disease in her father; Hypertension in her father, mother, and paternal grandfather. Social History:  reports that she has never smoked. She has never used smokeless tobacco. She reports that she does not drink alcohol or use illicit drugs.   Prenatal Transfer Tool  Maternal Diabetes: No Genetic Screening: Normal Maternal Ultrasounds/Referrals: Normal Fetal Ultrasounds or other Referrals:  None Maternal Substance Abuse:  No Significant Maternal Medications:  None Significant Maternal Lab Results:  None Other Comments:  None  Review of Systems  Eyes: Negative for blurred vision.  Gastrointestinal: Negative for abdominal pain.  Neurological: Negative for headaches.      Last menstrual period 03/23/2014, unknown if currently breastfeeding. Maternal Exam:  Abdomen: Fetal presentation: vertex     Physical Exam  Cardiovascular: Normal rate and regular rhythm.   Respiratory: Effort normal and breath sounds normal.   GI: Soft. There is no tenderness.  Neurological: She has normal reflexes.    Prenatal labs: ABO, Rh: --/--/O POS (08/03 1225) Antibody: NEG (08/03 1225) Rubella: Immune (01/06 0000) RPR: Non Reactive (08/03 1225)  HBsAg: Negative (01/06 0000)  HIV: Non-reactive (01/06 0000)  GBS:     Assessment/Plan: 24 yo @ 39 1/7 weeks for repeat cesarean section Risks reviewed including infection, organ damage, bleeding/tranfusion-HIV/Hep, DVT/PE, pneumonia. All questions answered.   Yuriel Lopezmartinez II,Ashtyn Meland E 12/26/2014, 6:16 PM

## 2014-12-27 ENCOUNTER — Inpatient Hospital Stay (HOSPITAL_COMMUNITY): Payer: BLUE CROSS/BLUE SHIELD | Admitting: Anesthesiology

## 2014-12-27 ENCOUNTER — Encounter (HOSPITAL_COMMUNITY)
Admission: AD | Disposition: A | Discharge status: Home / Self Care | Payer: Self-pay | Source: Ambulatory Visit | Attending: Obstetrics and Gynecology

## 2014-12-27 ENCOUNTER — Inpatient Hospital Stay (HOSPITAL_COMMUNITY)
Admission: RE | Admit: 2014-12-27 | Discharge: 2014-12-29 | DRG: 766 | Disposition: A | Discharge status: Home / Self Care | Payer: BLUE CROSS/BLUE SHIELD | Source: Ambulatory Visit | Attending: Obstetrics and Gynecology | Admitting: Obstetrics and Gynecology

## 2014-12-27 ENCOUNTER — Encounter (HOSPITAL_COMMUNITY): Payer: Self-pay | Admitting: *Deleted

## 2014-12-27 DIAGNOSIS — Z3A39 39 weeks gestation of pregnancy: Secondary | ICD-10-CM | POA: Diagnosis present

## 2014-12-27 DIAGNOSIS — Z8249 Family history of ischemic heart disease and other diseases of the circulatory system: Secondary | ICD-10-CM | POA: Diagnosis not present

## 2014-12-27 DIAGNOSIS — Z833 Family history of diabetes mellitus: Secondary | ICD-10-CM

## 2014-12-27 DIAGNOSIS — O3421 Maternal care for scar from previous cesarean delivery: Principal | ICD-10-CM | POA: Diagnosis present

## 2014-12-27 DIAGNOSIS — Z6839 Body mass index (BMI) 39.0-39.9, adult: Secondary | ICD-10-CM

## 2014-12-27 DIAGNOSIS — Z98891 History of uterine scar from previous surgery: Secondary | ICD-10-CM

## 2014-12-27 DIAGNOSIS — O99214 Obesity complicating childbirth: Secondary | ICD-10-CM | POA: Diagnosis present

## 2014-12-27 SURGERY — Surgical Case
Anesthesia: Spinal | Site: Abdomen

## 2014-12-27 MED ORDER — LACTATED RINGERS IV SOLN
INTRAVENOUS | Status: DC
  Administered 2014-12-28: via INTRAVENOUS

## 2014-12-27 MED ORDER — NALBUPHINE HCL 10 MG/ML IJ SOLN
INTRAMUSCULAR | Status: AC
  Filled 2014-12-27: qty 1

## 2014-12-27 MED ORDER — BUPIVACAINE IN DEXTROSE 0.75-8.25 % IT SOLN
INTRATHECAL | Status: DC | PRN
  Administered 2014-12-27: 11.25 mg via INTRATHECAL

## 2014-12-27 MED ORDER — WITCH HAZEL-GLYCERIN EX PADS: 1.0000 "application " | MEDICATED_PAD | CUTANEOUS | Status: DC | PRN

## 2014-12-27 MED ORDER — OXYTOCIN 10 UNIT/ML IJ SOLN
INTRAMUSCULAR | Status: AC
  Filled 2014-12-27: qty 4

## 2014-12-27 MED ORDER — CEFAZOLIN SODIUM-DEXTROSE 2-3 GM-% IV SOLR
2.0000 g | INTRAVENOUS | Status: AC
  Administered 2014-12-27: 2 g via INTRAVENOUS

## 2014-12-27 MED ORDER — NALBUPHINE HCL 10 MG/ML IJ SOLN: 5.0000 mg | Freq: Once | INTRAMUSCULAR | Status: AC | PRN

## 2014-12-27 MED ORDER — SIMETHICONE 80 MG PO CHEW: 80.0000 mg | CHEWABLE_TABLET | ORAL | Status: DC | PRN

## 2014-12-27 MED ORDER — ONDANSETRON HCL 4 MG/2ML IJ SOLN: 4.0000 mg | Freq: Three times a day (TID) | INTRAMUSCULAR | Status: DC | PRN

## 2014-12-27 MED ORDER — MENTHOL 3 MG MT LOZG: 1.0000 | LOZENGE | OROMUCOSAL | Status: DC | PRN

## 2014-12-27 MED ORDER — DIPHENHYDRAMINE HCL 25 MG PO CAPS: 25.0000 mg | ORAL_CAPSULE | Freq: Four times a day (QID) | ORAL | Status: DC | PRN

## 2014-12-27 MED ORDER — LACTATED RINGERS IV SOLN
INTRAVENOUS | Status: DC | PRN
  Administered 2014-12-27 (×3): via INTRAVENOUS

## 2014-12-27 MED ORDER — OXYTOCIN 10 UNIT/ML IJ SOLN
40.0000 [IU] | INTRAVENOUS | Status: DC | PRN
  Administered 2014-12-27: 40 [IU] via INTRAVENOUS

## 2014-12-27 MED ORDER — ONDANSETRON HCL 4 MG/2ML IJ SOLN: 4.0000 mg | Freq: Once | INTRAMUSCULAR | Status: DC | PRN

## 2014-12-27 MED ORDER — PHENYLEPHRINE 8 MG IN D5W 100 ML (0.08MG/ML) PREMIX OPTIME
INJECTION | INTRAVENOUS | Status: DC | PRN
  Administered 2014-12-27: 60 ug/min via INTRAVENOUS

## 2014-12-27 MED ORDER — DIPHENHYDRAMINE HCL 50 MG/ML IJ SOLN: 12.5000 mg | INTRAMUSCULAR | Status: DC | PRN

## 2014-12-27 MED ORDER — OXYCODONE-ACETAMINOPHEN 5-325 MG PO TABS: 2.0000 | ORAL_TABLET | ORAL | Status: DC | PRN

## 2014-12-27 MED ORDER — NALOXONE HCL 1 MG/ML IJ SOLN: 1.0000 ug/kg/h | INTRAVENOUS | Status: DC | PRN

## 2014-12-27 MED ORDER — SCOPOLAMINE 1 MG/3DAYS TD PT72: 1.0000 | MEDICATED_PATCH | Freq: Once | TRANSDERMAL | Status: DC

## 2014-12-27 MED ORDER — SCOPOLAMINE 1 MG/3DAYS TD PT72
MEDICATED_PATCH | TRANSDERMAL | Status: AC
  Filled 2014-12-27: qty 1

## 2014-12-27 MED ORDER — TETANUS-DIPHTH-ACELL PERTUSSIS 5-2.5-18.5 LF-MCG/0.5 IM SUSP: 0.5000 mL | Freq: Once | INTRAMUSCULAR | Status: DC

## 2014-12-27 MED ORDER — FENTANYL CITRATE (PF) 100 MCG/2ML IJ SOLN
INTRAMUSCULAR | Status: DC | PRN
  Administered 2014-12-27: 10 ug via INTRAVENOUS

## 2014-12-27 MED ORDER — ONDANSETRON HCL 4 MG/2ML IJ SOLN
INTRAMUSCULAR | Status: AC
  Filled 2014-12-27: qty 2

## 2014-12-27 MED ORDER — KETOROLAC TROMETHAMINE 30 MG/ML IJ SOLN
INTRAMUSCULAR | Status: AC
  Filled 2014-12-27: qty 1

## 2014-12-27 MED ORDER — LACTATED RINGERS IV SOLN: INTRAVENOUS | Status: DC

## 2014-12-27 MED ORDER — MEPERIDINE HCL 25 MG/ML IJ SOLN: 6.2500 mg | INTRAMUSCULAR | Status: DC | PRN

## 2014-12-27 MED ORDER — KETOROLAC TROMETHAMINE 30 MG/ML IJ SOLN: 30.0000 mg | Freq: Four times a day (QID) | INTRAMUSCULAR | Status: AC | PRN

## 2014-12-27 MED ORDER — CEFAZOLIN SODIUM-DEXTROSE 2-3 GM-% IV SOLR
INTRAVENOUS | Status: AC
Start: 2014-12-27 — End: 2014-12-28
  Filled 2014-12-27: qty 50

## 2014-12-27 MED ORDER — PRENATAL MULTIVITAMIN CH
1.0000 | ORAL_TABLET | Freq: Every day | ORAL | Status: DC
  Administered 2014-12-28 – 2014-12-29 (×2): 1 via ORAL
  Filled 2014-12-27 (×2): qty 1

## 2014-12-27 MED ORDER — NALBUPHINE HCL 10 MG/ML IJ SOLN
5.0000 mg | INTRAMUSCULAR | Status: DC | PRN
  Administered 2014-12-27: 5 mg via SUBCUTANEOUS

## 2014-12-27 MED ORDER — SIMETHICONE 80 MG PO CHEW
80.0000 mg | CHEWABLE_TABLET | ORAL | Status: DC
  Administered 2014-12-28 – 2014-12-29 (×2): 80 mg via ORAL
  Filled 2014-12-27 (×2): qty 1

## 2014-12-27 MED ORDER — IBUPROFEN 600 MG PO TABS
600.0000 mg | ORAL_TABLET | Freq: Four times a day (QID) | ORAL | Status: DC
  Administered 2014-12-27 – 2014-12-29 (×7): 600 mg via ORAL
  Filled 2014-12-27 (×7): qty 1

## 2014-12-27 MED ORDER — FENTANYL CITRATE (PF) 100 MCG/2ML IJ SOLN: 25.0000 ug | INTRAMUSCULAR | Status: DC | PRN

## 2014-12-27 MED ORDER — 0.9 % SODIUM CHLORIDE (POUR BTL) OPTIME
TOPICAL | Status: DC | PRN
  Administered 2014-12-27: 1000 mL

## 2014-12-27 MED ORDER — NALBUPHINE HCL 10 MG/ML IJ SOLN: 5.0000 mg | INTRAMUSCULAR | Status: DC | PRN

## 2014-12-27 MED ORDER — SODIUM CHLORIDE 0.9 % IJ SOLN: 3.0000 mL | INTRAMUSCULAR | Status: DC | PRN

## 2014-12-27 MED ORDER — OXYTOCIN 40 UNITS IN LACTATED RINGERS INFUSION - SIMPLE MED: 62.5000 mL/h | INTRAVENOUS | Status: AC

## 2014-12-27 MED ORDER — SIMETHICONE 80 MG PO CHEW
80.0000 mg | CHEWABLE_TABLET | Freq: Three times a day (TID) | ORAL | Status: DC
  Administered 2014-12-28 (×3): 80 mg via ORAL
  Filled 2014-12-27 (×3): qty 1

## 2014-12-27 MED ORDER — SCOPOLAMINE 1 MG/3DAYS TD PT72
1.0000 | MEDICATED_PATCH | Freq: Once | TRANSDERMAL | Status: DC
  Administered 2014-12-27: 1.5 mg via TRANSDERMAL

## 2014-12-27 MED ORDER — ACETAMINOPHEN 325 MG PO TABS
650.0000 mg | ORAL_TABLET | ORAL | Status: DC | PRN
  Administered 2014-12-27: 650 mg via ORAL
  Filled 2014-12-27: qty 2

## 2014-12-27 MED ORDER — NALOXONE HCL 0.4 MG/ML IJ SOLN: 0.4000 mg | INTRAMUSCULAR | Status: DC | PRN

## 2014-12-27 MED ORDER — DIBUCAINE 1 % RE OINT: 1.0000 "application " | TOPICAL_OINTMENT | RECTAL | Status: DC | PRN

## 2014-12-27 MED ORDER — MORPHINE SULFATE 0.5 MG/ML IJ SOLN
INTRAMUSCULAR | Status: AC
  Filled 2014-12-27: qty 100

## 2014-12-27 MED ORDER — OXYCODONE-ACETAMINOPHEN 5-325 MG PO TABS
1.0000 | ORAL_TABLET | ORAL | Status: DC | PRN
  Administered 2014-12-28 – 2014-12-29 (×3): 1 via ORAL
  Filled 2014-12-27 (×3): qty 1

## 2014-12-27 MED ORDER — PHENYLEPHRINE 8 MG IN D5W 100 ML (0.08MG/ML) PREMIX OPTIME
INJECTION | INTRAVENOUS | Status: AC
  Filled 2014-12-27: qty 100

## 2014-12-27 MED ORDER — LANOLIN HYDROUS EX OINT: 1.0000 "application " | TOPICAL_OINTMENT | CUTANEOUS | Status: DC | PRN

## 2014-12-27 MED ORDER — ONDANSETRON HCL 4 MG/2ML IJ SOLN
INTRAMUSCULAR | Status: DC | PRN
  Administered 2014-12-27: 4 mg via INTRAVENOUS

## 2014-12-27 MED ORDER — KETOROLAC TROMETHAMINE 30 MG/ML IJ SOLN
30.0000 mg | Freq: Four times a day (QID) | INTRAMUSCULAR | Status: AC | PRN
  Administered 2014-12-27: 30 mg via INTRAMUSCULAR

## 2014-12-27 MED ORDER — FENTANYL CITRATE (PF) 100 MCG/2ML IJ SOLN
INTRAMUSCULAR | Status: AC
  Filled 2014-12-27: qty 4

## 2014-12-27 MED ORDER — DIPHENHYDRAMINE HCL 25 MG PO CAPS
25.0000 mg | ORAL_CAPSULE | ORAL | Status: DC | PRN
  Filled 2014-12-27: qty 1

## 2014-12-27 MED ORDER — SENNOSIDES-DOCUSATE SODIUM 8.6-50 MG PO TABS
2.0000 | ORAL_TABLET | ORAL | Status: DC
  Administered 2014-12-28 – 2014-12-29 (×2): 2 via ORAL
  Filled 2014-12-27 (×2): qty 2

## 2014-12-27 MED ORDER — MORPHINE SULFATE (PF) 0.5 MG/ML IJ SOLN
INTRAMUSCULAR | Status: DC | PRN
  Administered 2014-12-27: .2 mg via EPIDURAL

## 2014-12-27 MED ORDER — ZOLPIDEM TARTRATE 5 MG PO TABS: 5.0000 mg | ORAL_TABLET | Freq: Every evening | ORAL | Status: DC | PRN

## 2014-12-27 MED ORDER — LACTATED RINGERS IV SOLN
Freq: Once | INTRAVENOUS | Status: AC
  Administered 2014-12-27: 13:00:00 via INTRAVENOUS

## 2014-12-27 SURGICAL SUPPLY — 34 items
APL SKNCLS STERI-STRIP NONHPOA (GAUZE/BANDAGES/DRESSINGS) ×1
BENZOIN TINCTURE PRP APPL 2/3 (GAUZE/BANDAGES/DRESSINGS) ×2 IMPLANT
CLAMP CORD UMBIL (MISCELLANEOUS) ×2 IMPLANT
CLOSURE WOUND 1/2 X4 (GAUZE/BANDAGES/DRESSINGS) ×1
CLOTH BEACON ORANGE TIMEOUT ST (SAFETY) ×3 IMPLANT
DRAPE SHEET LG 3/4 BI-LAMINATE (DRAPES) ×2 IMPLANT
DRSG OPSITE POSTOP 4X10 (GAUZE/BANDAGES/DRESSINGS) ×3 IMPLANT
DURAPREP 26ML APPLICATOR (WOUND CARE) ×3 IMPLANT
ELECT REM PT RETURN 9FT ADLT (ELECTROSURGICAL) ×3
ELECTRODE REM PT RTRN 9FT ADLT (ELECTROSURGICAL) ×1 IMPLANT
EXTRACTOR VACUUM M CUP 4 TUBE (SUCTIONS) ×1 IMPLANT
EXTRACTOR VACUUM M CUP 4' TUBE (SUCTIONS) ×1
GLOVE BIO SURGEON STRL SZ7.5 (GLOVE) ×3 IMPLANT
GOWN STRL REUS W/TWL LRG LVL3 (GOWN DISPOSABLE) ×6 IMPLANT
KIT ABG SYR 3ML LUER SLIP (SYRINGE) ×3 IMPLANT
NDL HYPO 21X1.5 SAFETY (NEEDLE) ×1 IMPLANT
NDL HYPO 25X5/8 SAFETYGLIDE (NEEDLE) ×1 IMPLANT
NEEDLE HYPO 21X1.5 SAFETY (NEEDLE) ×3 IMPLANT
NEEDLE HYPO 25X5/8 SAFETYGLIDE (NEEDLE) ×3 IMPLANT
NS IRRIG 1000ML POUR BTL (IV SOLUTION) ×3 IMPLANT
PACK C SECTION WH (CUSTOM PROCEDURE TRAY) ×3 IMPLANT
PAD ABD 7.5X8 STRL (GAUZE/BANDAGES/DRESSINGS) ×2 IMPLANT
PAD OB MATERNITY 4.3X12.25 (PERSONAL CARE ITEMS) ×3 IMPLANT
SPONGE GAUZE 4X4 12PLY STER LF (GAUZE/BANDAGES/DRESSINGS) ×4 IMPLANT
STRIP CLOSURE SKIN 1/2X4 (GAUZE/BANDAGES/DRESSINGS) ×1 IMPLANT
SUT MNCRL 0 VIOLET CTX 36 (SUTURE) ×4 IMPLANT
SUT MONOCRYL 0 CTX 36 (SUTURE) ×8
SUT PDS AB 0 CTX 60 (SUTURE) ×3 IMPLANT
SUT PLAIN 2 0 XLH (SUTURE) ×2 IMPLANT
SUT VIC AB 4-0 KS 27 (SUTURE) ×3 IMPLANT
SYR 30ML LL (SYRINGE) ×3 IMPLANT
TAPE CLOTH SURG 4X10 WHT LF (GAUZE/BANDAGES/DRESSINGS) ×2 IMPLANT
TOWEL OR 17X24 6PK STRL BLUE (TOWEL DISPOSABLE) ×3 IMPLANT
TRAY FOLEY CATH SILVER 14FR (SET/KITS/TRAYS/PACK) ×3 IMPLANT

## 2014-12-27 NOTE — Progress Notes (Signed)
No changes to H&P per patient history Reviewed with patient procedure-cesarean section All questions answered 

## 2014-12-27 NOTE — Anesthesia Procedure Notes (Signed)
Spinal Patient location during procedure: OR Staffing Anesthesiologist: Shawnelle Spoerl Performed by: anesthesiologist  Preanesthetic Checklist Completed: patient identified, site marked, surgical consent, pre-op evaluation, timeout performed, IV checked, risks and benefits discussed and monitors and equipment checked Spinal Block Patient position: sitting Prep: ChloraPrep Patient monitoring: continuous pulse ox, blood pressure and heart rate Approach: midline Location: L3-4 Injection technique: single-shot Needle Needle type: Sprotte  Needle gauge: 24 G Needle length: 9 cm Additional Notes Functioning IV was confirmed and monitors were applied. Sterile prep and drape, including hand hygiene, mask and sterile gloves were used. The patient was positioned and the spine was prepped. The skin was anesthetized with lidocaine.  Free flow of clear CSF was obtained prior to injecting local anesthetic into the CSF.  The spinal needle aspirated freely following injection.  The needle was carefully withdrawn.  The patient tolerated the procedure well. Consent was obtained prior to procedure with all questions answered and concerns addressed. Risks including but not limited to bleeding, infection, nerve damage, paralysis, failed block, inadequate analgesia, allergic reaction, high spinal, itching and headache were discussed and the patient wished to proceed.   Michaelann Gunnoe, MD     

## 2014-12-27 NOTE — Lactation Note (Signed)
This note was copied from the chart of Yvonne [name]. Lactation Consultation Note  Patient Name: Yvonne [name] Date: 12/27/2014 Reason for consult: Initial assessment;Difficult latch This is Mom's 1st time BF, 1st baby would not latch. This baby is having difficulty her 1st 10 hours. Tongue thrusting at breast. After several attempts using breast compression (sandwiching of nipple) baby was able to sustain the latch and develop a good suckling rhythm. Mom denies any discomfort but slight nipple compression visible. RN has assisted Mom to hand express and supplement baby with small amount of colostrum using curved tipped syringe. Basic teaching reviewed with parents. Lactation brochure left for review, advised of OP services and support group. Encouraged Mom to continue to call for assist till baby is latching better.   Maternal Data Has patient been taught Hand Expression?: Yes Does the patient have breastfeeding experience prior to this delivery?: No  Feeding Feeding Type: Breast Fed  LATCH Score/Interventions Latch: Repeated attempts needed to sustain latch, nipple held in mouth throughout feeding, stimulation needed to elicit sucking reflex. Intervention(s): Adjust position;Assist with latch;Breast massage;Breast compression  Audible Swallowing: A few with stimulation  Type of Nipple: Everted at rest and after stimulation (short nipple shafts bilateral)  Comfort (Breast/Nipple): Soft / non-tender     Hold (Positioning): Assistance needed to correctly position infant at breast and maintain latch. Intervention(s): Support Pillows;Position options;Skin to skin;Breastfeeding basics reviewed  LATCH Score: 7  Lactation Tools Discussed/Used WIC Program: Yes   Consult Status Consult Status: Follow-up Date: 12/28/14 Follow-up type: In-patient    Alfred Levins 12/27/2014, 11:51 PM

## 2014-12-27 NOTE — Anesthesia Postprocedure Evaluation (Signed)
  Anesthesia Post-op Note  Patient: Yvonne [name]  Procedure(s) Performed: Procedure(s) (LRB): CESAREAN SECTION (N/A)  Patient Location: PACU  Anesthesia Type: Spinal  Level of Consciousness: awake and alert   Airway and Oxygen Therapy: Patient Spontanous Breathing  Post-op Pain: mild  Post-op Assessment: Post-op Vital signs reviewed, Patient's Cardiovascular Status Stable, Respiratory Function Stable, Patent Airway and No signs of Nausea or vomiting  Last Vitals:  Filed Vitals:   12/27/14 1640  BP: 118/63  Pulse: 115  Temp: 36.9 C  Resp: 18    Post-op Vital Signs: stable   Complications: No apparent anesthesia complications

## 2014-12-27 NOTE — Transfer of Care (Signed)
Immediate Anesthesia Transfer of Care Note  Patient: Yvonne [name]  Procedure(s) Performed: Procedure(s) with comments: CESAREAN SECTION (N/A) - Repeat edc 01/02/15 NKDA  Patient Location: PACU  Anesthesia Type:Spinal  Level of Consciousness: awake  Airway & Oxygen Therapy: Patient Spontanous Breathing  Post-op Assessment: Report given to RN  Post vital signs: Reviewed and stable  Last Vitals:  Filed Vitals:   12/27/14 1232  BP: 127/79  Pulse: 119  Temp: 37 C    Complications: No apparent anesthesia complications

## 2014-12-27 NOTE — Brief Op Note (Signed)
12/27/2014  2:27 PM  PATIENT:  Yvonne [name]  24 y.o. female  PRE-OPERATIVE DIAGNOSIS:  previous X 1  POST-OPERATIVE DIAGNOSIS:  previous X 1  PROCEDURE:  Procedure(s) with comments: CESAREAN SECTION (N/A) - Repeat edc 01/02/15 NKDA  SURGEON:  Surgeon(s) and Role:    * Harold Hedge, MD - Primary  PHYSICIAN ASSISTANT:   ASSISTANTS: none   ANESTHESIA:   spinal  EBL:  Total I/O In: 2300 [I.V.:2300] Out: 650 [Urine:50; Blood:600]  BLOOD ADMINISTERED:none  DRAINS: Urinary Catheter (Foley)   LOCAL MEDICATIONS USED:  NONE  SPECIMEN:  No Specimen  DISPOSITION OF SPECIMEN:  N/A  COUNTS:  YES  TOURNIQUET:  * No tourniquets in log *  DICTATION: .Other Dictation: Dictation Number [id number]  PLAN OF CARE: Admit to inpatient   PATIENT DISPOSITION:  PACU - hemodynamically stable.   Delay start of Pharmacological VTE agent (>24hrs) due to surgical blood loss or risk of bleeding: not applicable

## 2014-12-28 LAB — CBC
HCT: 28.3 % — ABNORMAL LOW (ref 36.0–46.0)
Hemoglobin: 9.1 g/dL — ABNORMAL LOW (ref 12.0–15.0)
MCH: 25.9 pg — AB (ref 26.0–34.0)
MCHC: 32.2 g/dL (ref 30.0–36.0)
MCV: 80.6 fL (ref 78.0–100.0)
Platelets: 192 10*3/uL (ref 150–400)
RBC: 3.51 MIL/uL — AB (ref 3.87–5.11)
RDW: 14.3 % (ref 11.5–15.5)
WBC: 10.1 10*3/uL (ref 4.0–10.5)

## 2014-12-28 LAB — BIRTH TISSUE RECOVERY COLLECTION (PLACENTA DONATION)

## 2014-12-28 NOTE — Addendum Note (Signed)
Addendum  created 12/28/14 0809 by Algis Greenhouse, CRNA   Modules edited: Notes Section   Notes Section:  File: [id number]

## 2014-12-28 NOTE — Anesthesia Postprocedure Evaluation (Signed)
Anesthesia Post Note  Patient: Yvonne [name]  Procedure(s) Performed: Procedure(s) (LRB): CESAREAN SECTION (N/A)  Anesthesia type: Spinal  Patient location: Mother/Baby  Post pain: Pain level controlled  Post assessment: Post-op Vital signs reviewed  Last Vitals:  Filed Vitals:   12/28/14 0356  BP: 117/59  Pulse: 81  Temp: 36.8 C  Resp: 18    Post vital signs: Reviewed  Level of consciousness: awake  Complications: No apparent anesthesia complications

## 2014-12-28 NOTE — Op Note (Signed)
Yvonne, [name] NO.:  [id number]  MEDICAL RECORD NO.:  [id number]  LOCATION:  9125                          FACILITY:  [facility]  PHYSICIAN:  Guy Sandifer. Henderson Cloud, M.D. DATE OF BIRTH:  [birth year]  DATE OF PROCEDURE:  12/27/2014 DATE OF DISCHARGE:                              OPERATIVE REPORT   PREOPERATIVE DIAGNOSIS:  Previous cesarean section, desires repeat.  POSTOPERATIVE DIAGNOSIS:  Previous cesarean section, desires repeat.  PROCEDURE:  Repeat low transverse cesarean section.  SURGEON:  Guy Sandifer. Henderson Cloud, M.D.  ANESTHESIA:  Spinal.  ESTIMATED BLOOD LOSS:  750 mL.  FINDINGS:  Viable female infant.  Arterial cord pH, birth weight pending.  INDICATIONS AND CONSENT:  This patient is a 24 year old, G2, P1 at 28- 6/7th weeks with previous cesarean section.  She desires repeat. Potential risks and complications have been reviewed preoperatively including, but not limited to, infection, organ damage, bleeding requiring transfusion of blood products with HIV and hepatitis acquisition, DVT, PE, pneumonia.  All questions have been answered and consent was signed on the chart.  DESCRIPTION OF PROCEDURE:  The patient was taken to the operating room where she was identified.  Spinal anesthetic was placed with Anesthesia, and she was placed in dorsal supine position with a 15-degree left lateral wedge.  She was prepped.  Foley catheter was placed in the bladder to drain and she was draped per [facility] protocol in a sterile manner.  After testing for adequate spinal anesthesia, a time- out was undertaken.  Skin was entered through a previous Pfannenstiel scar.  Dissection was carried out in layers of the peritoneum. Peritoneum was incised and extended superiorly and inferiorly. Vesicouterine peritoneum was taken down cephalad laterally.  The bladder flap was developed.  The bladder blade was placed.  Uterus was incised in a low transverse manner and the  uterus was entered bluntly.  Clear fluid was noted.  The uterine incision was extended cephalad laterally with fingers.  Vertex delivered with the aid of a vacuum extractor without difficulty.  Remainder of the baby delivered without difficulty and good cry and tone was noted.  Cord was clamped and cut.  The baby was handed to awaiting Pediatrics team.  Placenta was manually delivered.  Uterine cavity was clean.  Uterus was closed in 2 running locking imbricating layers of 0 Monocryl suture which achieves good hemostasis.  Anterior peritoneum was closed in a running fashion with 0 Monocryl suture which was also used to reapproximate the pyramidalis muscle in midline.  Anterior rectus fascia was closed in a running fashion with a 0 looped PDS suture. Subcutaneous layer was closed with interrupted plain, subcuticular closure was done with Vicryl on a Keith needle.  Benzoin and Steri- Strips were applied.  Pressure dressing was applied.  All counts were correct, and the patient was taken to the recovery room in stable condition.     Guy Sandifer Henderson Cloud, M.D.     JET/MEDQ  D:  12/27/2014  T:  12/28/2014  Job:  [id number]

## 2014-12-28 NOTE — Progress Notes (Signed)
Subjective: Postpartum Day one: Cesarean Delivery Patient reports incisional pain and tolerating PO.    Objective: Vital signs in last 24 hours: Temp:  [97.7 F (36.5 C)-98.8 F (37.1 C)] 98.3 F (36.8 C) (08/06 0356) Pulse Rate:  [81-119] 81 (08/06 0356) Resp:  [17-21] 18 (08/06 0356) BP: (104-127)/(50-79) 117/59 mmHg (08/06 0356) SpO2:  [97 %-100 %] 97 % (08/06 0356)  Physical Exam:  General: alert Lochia: appropriate Uterine Fundus: firm Incision: healing well DVT Evaluation: No evidence of DVT seen on physical exam.   Recent Labs  12/25/14 1225 12/28/14 0555  HGB 11.4* 9.1*  HCT 36.0 28.3*    Assessment/Plan: Status post Cesarean section. Doing well postoperatively.  Continue current care.  Sherif Millspaugh S 12/28/2014, 8:32 AM

## 2014-12-29 MED ORDER — OXYCODONE-ACETAMINOPHEN 7.5-325 MG PO TABS: 1.0000 | ORAL_TABLET | ORAL | Status: DC | PRN

## 2014-12-29 NOTE — Discharge Summary (Signed)
Obstetric Discharge Summary Reason for Admission: cesarean section Prenatal Procedures: none Intrapartum Procedures: cesarean: low cervical, transverse Postpartum Procedures: none Complications-Operative and Postpartum: none HEMOGLOBIN  Date Value Ref Range Status  12/28/2014 9.1* 12.0 - 15.0 g/dL Final   HCT  Date Value Ref Range Status  12/28/2014 28.3* 36.0 - 46.0 % Final    Physical Exam:  General: alert Lochia: appropriate Uterine Fundus: firm Incision: healing well DVT Evaluation: No evidence of DVT seen on physical exam.  Discharge Diagnoses: Term Pregnancy-delivered  Discharge Information: Date: 12/29/2014 Activity: pelvic rest Diet: routine Medications: PNV and Percocet Condition: stable Instructions: refer to practice specific booklet Discharge to: home   Newborn Data: Live born female  Birth Weight: 8 lb 2.2 oz (3691 g) APGAR: 8, 9  Home with mother.  Sparkles Mcneely S 12/29/2014, 8:49 AM

## 2014-12-30 ENCOUNTER — Encounter (HOSPITAL_COMMUNITY): Payer: Self-pay | Admitting: Obstetrics and Gynecology

## 2015-02-05 ENCOUNTER — Other Ambulatory Visit: Payer: Self-pay | Admitting: Obstetrics and Gynecology

## 2015-02-06 LAB — CYTOLOGY - PAP

## 2016-07-29 ENCOUNTER — Emergency Department (INDEPENDENT_AMBULATORY_CARE_PROVIDER_SITE_OTHER): Payer: BLUE CROSS/BLUE SHIELD

## 2016-07-29 ENCOUNTER — Emergency Department
Admission: EM | Admit: 2016-07-29 | Discharge: 2016-07-29 | Disposition: A | Discharge status: Home / Self Care | Payer: BLUE CROSS/BLUE SHIELD | Source: Home / Self Care | Attending: Family Medicine | Admitting: Family Medicine

## 2016-07-29 DIAGNOSIS — R112 Nausea with vomiting, unspecified: Secondary | ICD-10-CM

## 2016-07-29 DIAGNOSIS — R197 Diarrhea, unspecified: Secondary | ICD-10-CM

## 2016-07-29 DIAGNOSIS — D72829 Elevated white blood cell count, unspecified: Secondary | ICD-10-CM

## 2016-07-29 DIAGNOSIS — R1010 Upper abdominal pain, unspecified: Secondary | ICD-10-CM | POA: Diagnosis not present

## 2016-07-29 DIAGNOSIS — R109 Unspecified abdominal pain: Secondary | ICD-10-CM

## 2016-07-29 LAB — POCT URINALYSIS DIP (MANUAL ENTRY)
Bilirubin, UA: NEGATIVE
Glucose, UA: NEGATIVE
Leukocytes, UA: NEGATIVE
NITRITE UA: NEGATIVE
PH UA: 5.5 (ref 5–8)
PROTEIN UA: NEGATIVE
RBC UA: NEGATIVE
UROBILINOGEN UA: 0.2 (ref 0–1)

## 2016-07-29 LAB — COMPLETE METABOLIC PANEL WITH GFR
ALBUMIN: 4.8 g/dL (ref 3.6–5.1)
ALK PHOS: 88 U/L (ref 33–115)
ALT: 49 U/L — ABNORMAL HIGH (ref 6–29)
AST: 23 U/L (ref 10–30)
BUN: 13 mg/dL (ref 7–25)
CALCIUM: 9.7 mg/dL (ref 8.6–10.2)
CHLORIDE: 104 mmol/L (ref 98–110)
CO2: 20 mmol/L (ref 20–31)
Creat: 0.89 mg/dL (ref 0.50–1.10)
GFR, Est African American: >89 mL/min (ref >=60)
Glucose, Bld: 79 mg/dL (ref 65–99)
POTASSIUM: 4.3 mmol/L (ref 3.5–5.3)
Sodium: 139 mmol/L (ref 135–146)
Total Bilirubin: 0.4 mg/dL (ref 0.2–1.2)
Total Protein: 8.1 g/dL (ref 6.1–8.1)

## 2016-07-29 LAB — POCT CBC W AUTO DIFF (K'VILLE URGENT CARE)

## 2016-07-29 LAB — AMYLASE: AMYLASE: 65 U/L (ref 0–105)

## 2016-07-29 LAB — POCT FASTING CBG KUC MANUAL ENTRY: POCT GLUCOSE (MANUAL ENTRY) KUC: 106 mg/dL — AB (ref 70–99)

## 2016-07-29 LAB — LIPASE: LIPASE: 12 U/L (ref 7–60)

## 2016-07-29 LAB — POCT URINE PREGNANCY: PREG TEST UR: NEGATIVE

## 2016-07-29 MED ORDER — METRONIDAZOLE 500 MG PO TABS: 500.0000 mg | ORAL_TABLET | Freq: Three times a day (TID) | ORAL | 0 refills | Status: DC

## 2016-07-29 MED ORDER — ONDANSETRON 4 MG PO TBDP: ORAL_TABLET | ORAL | 0 refills | Status: DC

## 2016-07-29 MED ORDER — HYDROCODONE-ACETAMINOPHEN 5-325 MG PO TABS
1.0000 | ORAL_TABLET | Freq: Four times a day (QID) | ORAL | 0 refills | Status: DC | PRN
Start: 2016-07-29 — End: 2017-11-23

## 2016-07-29 MED ORDER — CIPROFLOXACIN HCL 500 MG PO TABS: 500.0000 mg | ORAL_TABLET | Freq: Two times a day (BID) | ORAL | 0 refills | Status: DC

## 2016-07-29 MED ORDER — IOPAMIDOL (ISOVUE-300) INJECTION 61%
100.0000 mL | Freq: Once | INTRAVENOUS | Status: AC | PRN
  Administered 2016-07-29: 100 mL via INTRAVENOUS

## 2016-07-29 MED ORDER — ONDANSETRON 4 MG PO TBDP
4.0000 mg | ORAL_TABLET | Freq: Once | ORAL | Status: AC
  Administered 2016-07-29: 4 mg via ORAL

## 2016-07-29 NOTE — ED Triage Notes (Signed)
Woke up at 7 am was fine.  Started having abdominal pain at 8.  Stated that it goes away for a minute or so, then comes back. [facility]  Denies fever.

## 2016-07-29 NOTE — ED Notes (Signed)
As patient was leaving UC she became very dizzy.  Sat in chair, and attempted to take BP.  Cuff would not read.  BG 106, p-69, o2 sat 99.  Pt needed to go to BR and taken in to BR where she had a large amount of diarrhea.  Then laid on cot in room.  Color returned, BP 110/66.  Feeling better.  Discharged with mother, with i9nstructions to go to ER if feeling worse.

## 2016-07-29 NOTE — Discharge Instructions (Signed)
Begin clear liquids for 18 to 24 hours (Pedialyte while having diarrhea) until improved, then advance to a SUPERVALU INCBRAT diet (Bananas, Rice, Applesauce, Toast).  Then gradually resume a regular diet when tolerated.  Avoid milk products until well.  If symptoms become significantly worse during the night or over the weekend, proceed to the local emergency room.

## 2016-07-29 NOTE — ED Provider Notes (Signed)
[facility]    CSN: [id number] Arrival date & time: 07/29/16  1215     History   Chief Complaint Chief Complaint  Patient presents with  . Abdominal Pain    upper, across    HPI Yvonne [name] is a 26 y.o. female.   At 8am today patient developed non-radiating upper abdominal sharp/colicky pain.  She later developed nausea/vomiting and one episode of diarrhea.  Her pain is worse with pressure to the abdomen, movement, and deep inspiration.  No fevers, chills, and sweats.  Bowel movements have been normal until she developed diarrhea today.  No urinary symptoms.  No pelvic pain or vaginal discharge. Patient's last menstrual period was 07/03/2016.  Patient had an episode of lower abdominal pain 10/06/15, evaluated at local ER.  There a CT abdomen/pelvis was suggestive of a segmental colitis of uncertain etiology.  She was successfully treated with Cipro and Flagyl.  Later GI evaluation was negative. She has a past surgical history of appendectomy and two C-sections.   The history is provided by the patient.  Abdominal Pain  Pain location:  Epigastric and periumbilical Pain quality: aching, cramping and sharp   Pain radiates to:  Does not radiate Pain severity:  Moderate Onset quality:  Sudden Duration:  5 hours Timing:  Constant Progression:  Unchanged Chronicity:  New Context: previous surgery and recent illness   Context: not awakening from sleep, not diet changes, not eating, not recent travel, not sick contacts, not suspicious food intake and not trauma   Relieved by:  None tried Worsened by:  Coughing, deep breathing, movement and palpation Ineffective treatments:  None tried Associated symptoms: anorexia, diarrhea, fatigue, fever, nausea and vomiting   Associated symptoms: no chest pain, no chills, no constipation, no cough, no dysuria, no hematuria, no vaginal bleeding and no vaginal discharge     Past Medical History:  Diagnosis Date  . Gestational  hypertension   . Headache   . No pertinent past medical history     Patient Active Problem List   Diagnosis Date Noted  . Previous cesarean section 12/27/2014  . Abdominal pain affecting pregnancy, antepartum   . [redacted] weeks gestation of pregnancy   . ADHD (attention deficit hyperactivity disorder) 03/27/2014  . Gestational hypertension     Past Surgical History:  Procedure Laterality Date  . APPENDECTOMY    . CESAREAN SECTION  03/25/2012   Procedure: CESAREAN SECTION;  Surgeon: Leslie Andrea, MD;  Location: [facility];  Service: Obstetrics;  Laterality: N/A;  Primary Cesarean Section Delivery Boy @ [phone], Apgars 7/9  . CESAREAN SECTION N/A 12/27/2014   Procedure: CESAREAN SECTION;  Surgeon: Harold Hedge, MD;  Location: [facility];  Service: Obstetrics;  Laterality: N/A;  Repeat edc 01/02/15 NKDA    OB History    This patient's OB History needs to be verified. Open OB History to review and resolve any issues.   Gravida Para Term Preterm AB Living   2 2 2     2    SAB TAB Ectopic Multiple Live Births         0 2      Obstetric Comments   Previous pregnancy induced at 37 weeks, with placental abruption.        Home Medications    Prior to Admission medications   Medication Sig Start Date End Date Taking? Authorizing Provider  acetaminophen (TYLENOL) 325 MG tablet Take 650 mg by mouth every 6 (six) hours as needed for headache.  Historical Provider, MD  ciprofloxacin (CIPRO) 500 MG tablet Take 1 tablet (500 mg total) by mouth 2 (two) times daily. 07/29/16   Lattie Haw, MD  HYDROcodone-acetaminophen (NORCO/VICODIN) 5-325 MG tablet Take 1 tablet by mouth every 6 (six) hours as needed for moderate pain. 07/29/16   Lattie Haw, MD  metroNIDAZOLE (FLAGYL) 500 MG tablet Take 1 tablet (500 mg total) by mouth 3 (three) times daily. 07/29/16   Lattie Haw, MD  ondansetron (ZOFRAN ODT) 4 MG disintegrating tablet Take one tab by mouth Q6hr prn nausea.  Dissolve under tongue. 07/29/16    Lattie Haw, MD  pantoprazole (PROTONIX) 20 MG tablet Take 1 tablet (20 mg total) by mouth daily. 11/17/14   Aviva Signs, CNM  Prenatal Vit-Fe Fumarate-FA (PRENATAL MULTIVITAMIN) TABS tablet Take 1 tablet by mouth daily at 12 noon.    Historical Provider, MD    Family History Family History  Problem Relation Age of Onset  . Diabetes Mother   . Hypertension Mother   . Hypertension Father   . Heart disease Father   . Diabetes Maternal Grandmother   . Diabetes Maternal Grandfather   . Hypertension Paternal Grandfather     Social History Social History  Substance Use Topics  . Smoking status: Never Smoker  . Smokeless tobacco: Never Used  . Alcohol use No     Allergies   Patient has no known allergies.   Review of Systems Review of Systems  Constitutional: Positive for fatigue and fever. Negative for appetite change, chills and diaphoresis.  HENT: Negative.   Eyes: Negative.   Respiratory: Negative for cough.   Cardiovascular: Negative for chest pain.  Gastrointestinal: Positive for abdominal pain, anal bleeding, anorexia, diarrhea, nausea and vomiting. Negative for blood in stool and constipation.  Genitourinary: Negative for decreased urine volume, dysuria, frequency, hematuria, pelvic pain, urgency, vaginal bleeding, vaginal discharge and vaginal pain.  Musculoskeletal: Negative.   Skin: Negative.   Neurological: Negative.      Physical Exam Triage Vital Signs ED Triage Vitals  Enc Vitals Group     BP 07/29/16 1251 125/89     Pulse Rate 07/29/16 1251 105     Resp --      Temp 07/29/16 1251 97.9 F (36.6 C)     Temp Source 07/29/16 1251 Oral     SpO2 07/29/16 1251 97 %     Weight 07/29/16 1252 177 lb (80.3 kg)     Height 07/29/16 1252 4\' 11"  (1.499 m)     Head Circumference --      Peak Flow --      Pain Score 07/29/16 1253 8     Pain Loc --      Pain Edu? --      Excl. in GC? --    No data found.   Updated Vital Signs BP 125/89 (BP Location:  Left Arm)   Pulse 105   Temp 97.9 F (36.6 C) (Oral)   Ht 4\' 11"  (1.499 m)   Wt 177 lb (80.3 kg)   LMP 07/03/2016   SpO2 97%   BMI 35.75 kg/m   Visual Acuity Right Eye Distance:   Left Eye Distance:   Bilateral Distance:    Right Eye Near:   Left Eye Near:    Bilateral Near:     Physical Exam  Constitutional: She appears well-developed and well-nourished. No distress.  HENT:  Head: Normocephalic.  Right Ear: External ear normal.  Left Ear: External ear normal.  Nose: Nose normal.  Mouth/Throat: Oropharynx is clear and moist.  Eyes: Conjunctivae are normal. Pupils are equal, round, and reactive to light.  Neck: Neck supple.  Cardiovascular: Normal heart sounds.   Pulmonary/Chest: Breath sounds normal.  Abdominal: Soft. She exhibits no distension and no mass. Bowel sounds are decreased. There is no hepatosplenomegaly. There is tenderness in the right upper quadrant, epigastric area and periumbilical area. There is guarding. There is no rigidity, no rebound, no CVA tenderness, no tenderness at McBurney's point and negative Murphy's sign.    Musculoskeletal: She exhibits no edema.  Lymphadenopathy:    She has no cervical adenopathy.  Neurological: She is alert.  Skin: Skin is warm and dry. No rash noted. She is not diaphoretic.  Nursing note and vitals reviewed.    UC Treatments / Results  Labs (all labs ordered are listed, but only abnormal results are displayed) Labs Reviewed  POCT URINALYSIS DIP (MANUAL ENTRY) - Abnormal; Notable for the following:       Result Value   Ketones, POC UA trace (5) (*)     SG >= 1.030   COMPLETE METABOLIC PANEL WITH GFR  AMYLASE  LIPASE  POCT CBC W AUTO DIFF ([facility]):  WBC 17.8; LY 13.3; MO 4.8; GR 81.9; Hgb 15.7; Hct 48.1, Platelets 332   POCT URINE PREGNANCY negative    EKG  EKG Interpretation None       Radiology Ct Abdomen Pelvis W Contrast  Result Date: 07/29/2016 CLINICAL DATA:  Abdominal pain with  nausea and vomiting and elevated white blood cell count EXAM: CT ABDOMEN AND PELVIS WITH CONTRAST TECHNIQUE: Multidetector CT imaging of the abdomen and pelvis was performed using the standard protocol following bolus administration of intravenous contrast. CONTRAST:  ISOVUE-300 IOPAMIDOL (ISOVUE-300) INJECTION 61% COMPARISON:  None. FINDINGS: Lower chest: No acute abnormality. Hepatobiliary: Diffuse fatty infiltration of the liver is noted. The gallbladder is within normal limits. Focal fatty sparing is noted along the falciform ligament. Pancreas: Unremarkable. No pancreatic ductal dilatation or surrounding inflammatory changes. Spleen: Normal in size without focal abnormality. Adrenals/Urinary Tract: Adrenal glands are unremarkable. Kidneys are normal, without renal calculi, focal lesion, or hydronephrosis. Bladder is decompressed. Stomach/Bowel: The appendix has been surgically removed. No obstructive or inflammatory changes are seen. The colon is predominately fluid filled consistent with the given clinical history of diarrhea. Vascular/Lymphatic: No significant vascular findings are present. No enlarged abdominal or pelvic lymph nodes. Reproductive: Uterus and bilateral adnexa are unremarkable. Other: No abdominal wall hernia or abnormality. No abdominopelvic ascites. Musculoskeletal: No acute or significant osseous findings. IMPRESSION: Fluid filled colon consistent with the given clinical history of diarrhea. No other acute abnormality is noted. Electronically Signed   By: Alcide Clever M.D.   On: 07/29/2016 15:01    Procedures Procedures (including critical care time)  Medications Ordered in UC Medications  ondansetron (ZOFRAN-ODT) disintegrating tablet 4 mg (4 mg Oral Given 07/29/16 1318)     Initial Impression / Assessment and Plan / UC Course  I have reviewed the triage vital signs and the nursing notes.  Pertinent labs & imaging results that were available during my care of the patient  were reviewed by me and considered in my medical decision making (see chart for details).    Symptoms, exam, and leukocytosis suggestive of acute pancreatitis.  Negative CT scan abdomen/pelvis with contrast reassuring.  Amylase, lipase, CMP pending. Begin empiric Cipro and Flagyl.  Rx for Zofran ODT.  Rx for Lortab.  Begin clear liquids  for 18 to 24 hours (Pedialyte while having diarrhea) until improved, then advance to a SUPERVALU INCBRAT diet (Bananas, Rice, Applesauce, Toast).  Then gradually resume a regular diet when tolerated.  Avoid milk products until well.  If symptoms become significantly worse during the night or over the weekend, proceed to the local emergency room.  Followup with Family Doctor if not improved in about 4 days.   Final Clinical Impressions(s) / UC Diagnoses   Final diagnoses:  Pain of upper abdomen  Nausea vomiting and diarrhea    New Prescriptions New Prescriptions   CIPROFLOXACIN (CIPRO) 500 MG TABLET    Take 1 tablet (500 mg total) by mouth 2 (two) times daily.   HYDROCODONE-ACETAMINOPHEN (NORCO/VICODIN) 5-325 MG TABLET    Take 1 tablet by mouth every 6 (six) hours as needed for moderate pain.   METRONIDAZOLE (FLAGYL) 500 MG TABLET    Take 1 tablet (500 mg total) by mouth 3 (three) times daily.   ONDANSETRON (ZOFRAN ODT) 4 MG DISINTEGRATING TABLET    Take one tab by mouth Q6hr prn nausea.  Dissolve under tongue.     Lattie HawStephen A Uilani Sanville, MD 08/01/16 2026

## 2016-07-31 ENCOUNTER — Telehealth: Payer: Self-pay | Admitting: Emergency Medicine

## 2017-11-23 ENCOUNTER — Emergency Department (HOSPITAL_COMMUNITY): Payer: BLUE CROSS/BLUE SHIELD

## 2017-11-23 ENCOUNTER — Emergency Department (HOSPITAL_COMMUNITY)
Admission: EM | Admit: 2017-11-23 | Discharge: 2017-11-24 | Disposition: A | Discharge status: Home / Self Care | Payer: BLUE CROSS/BLUE SHIELD | Attending: Emergency Medicine | Admitting: Emergency Medicine

## 2017-11-23 ENCOUNTER — Other Ambulatory Visit: Payer: Self-pay

## 2017-11-23 ENCOUNTER — Encounter (HOSPITAL_COMMUNITY): Payer: Self-pay

## 2017-11-23 DIAGNOSIS — R1011 Right upper quadrant pain: Secondary | ICD-10-CM

## 2017-11-23 DIAGNOSIS — F909 Attention-deficit hyperactivity disorder, unspecified type: Secondary | ICD-10-CM | POA: Insufficient documentation

## 2017-11-23 DIAGNOSIS — N760 Acute vaginitis: Secondary | ICD-10-CM | POA: Insufficient documentation

## 2017-11-23 DIAGNOSIS — B9689 Other specified bacterial agents as the cause of diseases classified elsewhere: Secondary | ICD-10-CM

## 2017-11-23 LAB — COMPREHENSIVE METABOLIC PANEL
ALT: 24 U/L (ref 0–44)
AST: 17 U/L (ref 15–41)
Albumin: 3.9 g/dL (ref 3.5–5.0)
Alkaline Phosphatase: 63 U/L (ref 38–126)
Anion gap: 9 (ref 5–15)
BUN: 12 mg/dL (ref 6–20)
CHLORIDE: 106 mmol/L (ref 98–111)
CO2: 28 mmol/L (ref 22–32)
Calcium: 9.7 mg/dL (ref 8.9–10.3)
Creatinine, Ser: 1.38 mg/dL — ABNORMAL HIGH (ref 0.44–1.00)
GFR calc Af Amer: >60 mL/min (ref >60)
GFR calc non Af Amer: 52 mL/min — ABNORMAL LOW (ref >60)
Glucose, Bld: 98 mg/dL (ref 70–99)
Potassium: 3.7 mmol/L (ref 3.5–5.1)
SODIUM: 143 mmol/L (ref 135–145)
Total Bilirubin: 0.6 mg/dL (ref 0.3–1.2)
Total Protein: 7.6 g/dL (ref 6.5–8.1)

## 2017-11-23 LAB — URINALYSIS, ROUTINE W REFLEX MICROSCOPIC
Bilirubin Urine: NEGATIVE
GLUCOSE, UA: NEGATIVE mg/dL
Hgb urine dipstick: NEGATIVE
Ketones, ur: NEGATIVE mg/dL
LEUKOCYTES UA: NEGATIVE
Nitrite: NEGATIVE
Protein, ur: NEGATIVE mg/dL
Specific Gravity, Urine: 1.006 (ref 1.005–1.030)
pH: 5 (ref 5.0–8.0)

## 2017-11-23 LAB — WET PREP, GENITAL
SPERM: NONE SEEN
Trich, Wet Prep: NONE SEEN
Yeast Wet Prep HPF POC: NONE SEEN

## 2017-11-23 LAB — CBC
HCT: 41 % (ref 36.0–46.0)
Hemoglobin: 13.3 g/dL (ref 12.0–15.0)
MCH: 28.8 pg (ref 26.0–34.0)
MCHC: 32.4 g/dL (ref 30.0–36.0)
MCV: 88.7 fL (ref 78.0–100.0)
PLATELETS: 339 10*3/uL (ref 150–400)
RBC: 4.62 MIL/uL (ref 3.87–5.11)
RDW: 12.6 % (ref 11.5–15.5)
WBC: 11.4 10*3/uL — ABNORMAL HIGH (ref 4.0–10.5)

## 2017-11-23 LAB — I-STAT BETA HCG BLOOD, ED (MC, WL, AP ONLY): I-stat hCG, quantitative: <5 m[IU]/mL (ref <5)

## 2017-11-23 LAB — LIPASE, BLOOD: Lipase: 28 U/L (ref 11–51)

## 2017-11-23 MED ORDER — SUCRALFATE 1 G PO TABS: 1.0000 g | ORAL_TABLET | Freq: Three times a day (TID) | ORAL | 0 refills | Status: AC

## 2017-11-23 MED ORDER — ONDANSETRON HCL 4 MG/2ML IJ SOLN
4.0000 mg | Freq: Once | INTRAMUSCULAR | Status: AC
  Administered 2017-11-23: 4 mg via INTRAVENOUS
  Filled 2017-11-23: qty 2

## 2017-11-23 MED ORDER — GI COCKTAIL ~~LOC~~
30.0000 mL | Freq: Once | ORAL | Status: AC
  Administered 2017-11-23: 30 mL via ORAL
  Filled 2017-11-23: qty 30

## 2017-11-23 MED ORDER — DICYCLOMINE HCL 10 MG PO CAPS
10.0000 mg | ORAL_CAPSULE | Freq: Once | ORAL | Status: AC
  Administered 2017-11-23: 10 mg via ORAL
  Filled 2017-11-23: qty 1

## 2017-11-23 MED ORDER — SUCRALFATE 1 G PO TABS
1.0000 g | ORAL_TABLET | Freq: Once | ORAL | Status: AC
  Administered 2017-11-24: 1 g via ORAL
  Filled 2017-11-23: qty 1

## 2017-11-23 MED ORDER — SODIUM CHLORIDE 0.9 % IV BOLUS
500.0000 mL | Freq: Once | INTRAVENOUS | Status: AC
  Administered 2017-11-23: 500 mL via INTRAVENOUS

## 2017-11-23 MED ORDER — MORPHINE SULFATE (PF) 4 MG/ML IV SOLN
4.0000 mg | Freq: Once | INTRAVENOUS | Status: AC
  Administered 2017-11-23: 4 mg via INTRAVENOUS
  Filled 2017-11-23: qty 1

## 2017-11-23 MED ORDER — PANTOPRAZOLE SODIUM 20 MG PO TBEC: 20.0000 mg | DELAYED_RELEASE_TABLET | Freq: Every day | ORAL | 0 refills | Status: AC

## 2017-11-23 MED ORDER — IOPAMIDOL (ISOVUE-300) INJECTION 61%
INTRAVENOUS | Status: AC
  Filled 2017-11-23: qty 100

## 2017-11-23 MED ORDER — ONDANSETRON HCL 4 MG/2ML IJ SOLN
4.0000 mg | Freq: Once | INTRAMUSCULAR | Status: AC
  Administered 2017-11-24: 4 mg via INTRAVENOUS
  Filled 2017-11-23: qty 2

## 2017-11-23 MED ORDER — IOPAMIDOL (ISOVUE-300) INJECTION 61%
100.0000 mL | Freq: Once | INTRAVENOUS | Status: AC | PRN
  Administered 2017-11-23: 100 mL via INTRAVENOUS

## 2017-11-23 MED ORDER — METRONIDAZOLE 500 MG PO TABS: 500.0000 mg | ORAL_TABLET | Freq: Two times a day (BID) | ORAL | 0 refills | Status: AC

## 2017-11-23 NOTE — Discharge Instructions (Addendum)
You were given a prescription for antibiotics to treat bacterial vaginosis. Please take the antibiotic prescription fully. Do not drink alcohol while taking this medication.  You were also given a prescription for Carafate and Protonix to help with your abdominal pain. Please take these medications as prescribed.   Please follow up with your primary doctor within the next 5-7 days. Your pelvic ultrasound shows a 2cm mass near the right ovary.  It is recommended that you discuss this with your doctor and request for a non emergent pelvic MRI for further evaluation. If you do not have a primary care provider, information for a healthcare clinic has been provided for you to make arrangements for follow up care.  You were also given a referral to a gastroenterology doctor.  Please follow-up with the gastroenterology doctor in 1 week for reevaluation of your symptoms.  Please return to the ER sooner if you have any new or worsening symptoms, or if you have any of the following symptoms:  Abdominal pain that does not go away.  You have a fever.  You keep throwing up (vomiting).  The pain is felt only in portions of the abdomen. Pain in the right side could possibly be appendicitis. In an adult, pain in the left lower portion of the abdomen could be colitis or diverticulitis.  You pass bloody or black tarry stools.  There is bright red blood in the stool.  The constipation stays for more than 4 days.  There is belly (abdominal) or rectal pain.  You do not seem to be getting better.  You have any questions or concerns.

## 2017-11-23 NOTE — ED Provider Notes (Addendum)
[facility] DEPT Provider Note   CSN: [id number] Arrival date & time: 11/23/17  1626     History   Chief Complaint Chief Complaint  Patient presents with  . Abdominal Pain    HPI Yvonne [name] is a 27 y.o. female.  HPI   Patient is a 27 year old female with history of gestational hypertension, appendectomy, cesarean section, who presents the emergency department today for evaluation of abdominal pain that has been ongoing for the last 10 days.  She states that she has epigastric abdominal pain that radiates to the back.  Pain is constant and she rates it as 7-8/10.  It is worse when she sits for extended periods of time and lays on her back.  She states that she is also had nausea and vomiting every time she tries to eat.  Denies hematemesis.  No diarrhea or constipation.  No hematochezia or melena.  She also reports urinary frequency, but denies any dysuria, hematuria, or urgency.  Denies any fevers at home.  No chest pain or shortness of breath.  She does note that she was seen in urgent care 1 week ago and had x-rays completed which showed stool burden.  She was given laxatives to go home with which she states she took and has had no improvement of her symptoms.  She also had a UA drawn which was negative for UTI.  Did not have labs drawn at her visit.  Past Medical History:  Diagnosis Date  . Gestational hypertension   . Headache   . No pertinent past medical history     Patient Active Problem List   Diagnosis Date Noted  . Previous cesarean section 12/27/2014  . Abdominal pain affecting pregnancy, antepartum   . [redacted] weeks gestation of pregnancy   . ADHD (attention deficit hyperactivity disorder) 03/27/2014  . Gestational hypertension     Past Surgical History:  Procedure Laterality Date  . APPENDECTOMY    . CESAREAN SECTION  03/25/2012   Procedure: CESAREAN SECTION;  Surgeon: Allena Katz, MD;  Location: [facility];  Service: Obstetrics;   Laterality: N/A;  Primary Cesarean Section Delivery Boy @ [phone], Apgars 7/9  . CESAREAN SECTION N/A 12/27/2014   Procedure: CESAREAN SECTION;  Surgeon: Everlene Farrier, MD;  Location: [facility];  Service: Obstetrics;  Laterality: N/A;  Repeat edc 01/02/15 NKDA     OB History   This patient's OB History needs to be verified. Open OB History to review and resolve any issues.  Gravida  2   Para  2   Term  2   Preterm      AB      Living  2     SAB      TAB      Ectopic      Multiple  0   Live Births  2        Obstetric Comments  Previous pregnancy induced at 19 weeks, with placental abruption.          Home Medications    Prior to Admission medications   Medication Sig Start Date End Date Taking? Authorizing Provider  acetaminophen (TYLENOL) 325 MG tablet Take 650 mg by mouth every 6 (six) hours as needed for headache.   Yes [provider]  amphetamine-dextroamphetamine (ADDERALL) 10 MG tablet Take 10 mg by mouth 2 (two) times daily. 08/31/17  Yes [provider]  metroNIDAZOLE (FLAGYL) 500 MG tablet Take 1 tablet (500 mg total) by mouth 2 (  two) times daily for 7 days. 11/23/17 11/30/17  Galia Rahm S, PA-C  ondansetron (ZOFRAN) 4 MG tablet Take 1 tablet (4 mg total) by mouth every 8 (eight) hours as needed for up to 5 days for nausea or vomiting. 11/24/17 11/29/17  Danisa Kopec S, PA-C  pantoprazole (PROTONIX) 20 MG tablet Take 1 tablet (20 mg total) by mouth daily for 14 days. 11/23/17 12/07/17  Drake Wuertz S, PA-C  sucralfate (CARAFATE) 1 g tablet Take 1 tablet (1 g total) by mouth 3 (three) times daily with meals for 14 days. 11/23/17 12/07/17  Bladimir Auman S, PA-C    Family History Family History  Problem Relation Age of Onset  . Diabetes Mother   . Hypertension Mother   . Hypertension Father   . Heart disease Father   . Diabetes Maternal Grandmother   . Diabetes Maternal Grandfather   . Hypertension Paternal Grandfather     Social  History Social History   Tobacco Use  . Smoking status: Never Smoker  . Smokeless tobacco: Never Used  Substance Use Topics  . Alcohol use: No  . Drug use: No     Allergies   Patient has no known allergies.   Review of Systems Review of Systems  Constitutional: Negative for chills and fever.  HENT: Negative for ear pain and sore throat.   Eyes: Negative for pain and visual disturbance.  Respiratory: Negative for cough and shortness of breath.   Cardiovascular: Negative for chest pain and palpitations.  Gastrointestinal: Positive for abdominal pain, nausea and vomiting. Negative for blood in stool, constipation and diarrhea.  Genitourinary: Positive for flank pain. Negative for dysuria, hematuria, pelvic pain and urgency.  Musculoskeletal: Positive for back pain.  Skin: Negative for rash.  Neurological: Negative for headaches.  All other systems reviewed and are negative.   Physical Exam Updated Vital Signs BP 122/90 (BP Location: Left Arm)   Pulse 85   Temp 98.2 F (36.8 C) (Oral)   Resp 16   Ht _0  (1.499 m)   Wt 74.4 kg (164 lb)   LMP 11/03/2017 (Approximate)   SpO2 99%   BMI 33.12 kg/m   Physical Exam  Constitutional: She appears well-developed and well-nourished.  Non-toxic appearance. She does not appear ill. She appears distressed.  HENT:  Head: Normocephalic and atraumatic.  Mouth/Throat: Oropharynx is clear and moist.  Eyes: Conjunctivae are normal. No scleral icterus.  Neck: Neck supple.  Cardiovascular: Normal rate, regular rhythm, normal heart sounds and intact distal pulses.  No murmur heard. Pulmonary/Chest: Effort normal and breath sounds normal. No stridor. No respiratory distress. She has no wheezes. She has no rhonchi. She has no rales.  Abdominal: Soft.  TTP to epigastrium, RUQ, and RLQ, most severe to epigastrium. Bilat CVA TTP. Voluntary guarding. No rigidity. Normal bowel sounds.  Genitourinary:  Genitourinary Comments: Exam performed  by Rodney Booze,  exam chaperoned Date: 11/23/2017 Pelvic exam: normal external genitalia without evidence of trauma. VULVA: normal appearing vulva with no masses, tenderness or lesion. VAGINA: normal appearing vagina with normal color, white discharge present CERVIX: normal appearing cervix without lesions, cervical motion tenderness absent, cervical os closed with out purulent discharge; Wet prep and DNA probe for chlamydia and GC obtained.   ADNEXA: normal adnexa in size, right adnexal TTP UTERUS: uterus is normal size, shape, consistency and is mildly TTP  Musculoskeletal: She exhibits no edema.  Neurological: She is alert.  Skin: Skin is warm and dry. Capillary refill takes less than 2  seconds.  Psychiatric: She has a normal mood and affect.  Nursing note and vitals reviewed.  ED Treatments / Results  Labs (all labs ordered are listed, but only abnormal results are displayed) Labs Reviewed  WET PREP, GENITAL - Abnormal; Notable for the following components:      Result Value   Clue Cells Wet Prep HPF POC PRESENT (*)    WBC, Wet Prep HPF POC MODERATE (*)    All other components within normal limits  COMPREHENSIVE METABOLIC PANEL - Abnormal; Notable for the following components:   Creatinine, Ser 1.38 (*)    GFR calc non Af Amer 52 (*)    All other components within normal limits  CBC - Abnormal; Notable for the following components:   WBC 11.4 (*)    All other components within normal limits  URINALYSIS, ROUTINE W REFLEX MICROSCOPIC - Abnormal; Notable for the following components:   Color, Urine STRAW (*)    All other components within normal limits  LIPASE, BLOOD  I-STAT BETA HCG BLOOD, ED ([facility], [facility], [facility] ONLY)  GC/CHLAMYDIA PROBE AMP ([place]) NOT AT [facility]   EKG None  Radiology Ct Abdomen Pelvis W Contrast  Result Date: 11/23/2017 CLINICAL DATA:  Nausea, vomiting and abdominal pain EXAM: CT ABDOMEN AND PELVIS WITH CONTRAST TECHNIQUE: Multidetector CT imaging of the  abdomen and pelvis was performed using the standard protocol following bolus administration of intravenous contrast. CONTRAST:  139m ISOVUE-300 IOPAMIDOL (ISOVUE-300) INJECTION 61% COMPARISON:  CT abdomen and pelvis 07/29/2016 FINDINGS: LOWER CHEST: No basilar pulmonary nodules or pleural effusion. No apical pericardial effusion. HEPATOBILIARY: Normal hepatic contours and density. No intra- or extrahepatic biliary dilatation. Normal gallbladder. PANCREAS: Normal parenchymal contours without ductal dilatation. No peripancreatic fluid collection. SPLEEN: Normal. ADRENALS/URINARY TRACT: --Adrenal glands: Normal. --Right kidney/ureter: No hydronephrosis, nephroureterolithiasis, perinephric stranding or solid renal mass. --Left kidney/ureter: No hydronephrosis, nephroureterolithiasis, perinephric stranding or solid renal mass. --Urinary bladder: Normal for degree of distention STOMACH/BOWEL: --Stomach/Duodenum: No hiatal hernia or other gastric abnormality. Normal duodenal course. --Small bowel: No dilatation or inflammation. --Colon: No focal abnormality. --Appendix: Surgically absent. VASCULAR/LYMPHATIC: Normal course and caliber of the major abdominal vessels. No abdominal or pelvic lymphadenopathy. REPRODUCTIVE: No free fluid in the pelvis. MUSCULOSKELETAL. No bony spinal canal stenosis or focal osseous abnormality. OTHER: None. IMPRESSION: No acute abnormality of the abdomen or pelvis. Electronically Signed   By: KUlyses JarredM.D.   On: 11/23/2017 20:09    Procedures Procedures (including critical care time)  Medications Ordered in ED Medications  iopamidol (ISOVUE-300) 61 % injection (has no administration in time range)  sucralfate (CARAFATE) tablet 1 g (has no administration in time range)  ondansetron (ZOFRAN) injection 4 mg (has no administration in time range)  ondansetron (ZOFRAN) injection 4 mg (4 mg Intravenous Given 11/23/17 1732)  morphine 4 MG/ML injection 4 mg (4 mg Intravenous Given 11/23/17  1732)  gi cocktail (Maalox,Lidocaine,Donnatal) (30 mLs Oral Given 11/23/17 1732)  sodium chloride 0.9 % bolus 500 mL (0 mLs Intravenous Stopped 11/23/17 2157)  iopamidol (ISOVUE-300) 61 % injection 100 mL (100 mLs Intravenous Contrast Given 11/23/17 1950)  morphine 4 MG/ML injection 4 mg (4 mg Intravenous Given 11/23/17 2018)  dicyclomine (BENTYL) capsule 10 mg (10 mg Oral Given 11/23/17 2101)     Initial Impression / Assessment and Plan / ED Course  I have reviewed the triage vital signs and the nursing notes.  Pertinent labs & imaging results that were available during my care of the patient were reviewed by  me and considered in my medical decision making (see chart for details).     Final Clinical Impressions(s) / ED Diagnoses   Final diagnoses:  RUQ abdominal pain  Bacterial vaginosis   Patient presented with abdominal pain nausea and vomiting for the last 10 days.  Vital signs stable.  No fevers today.  Epigastric and right upper quadrant and left upper quadrant abdominal tenderness.  Her CBC shows a mild leukocytosis 11.4.  No anemia.  CMP with mild elevation in her creatinine 1.38 from 0.89.  Otherwise her left lites are stable.  No elevation in her alk phos or total bilirubin therefore had lower suspicion for gallbladder pathology initially.  Liver function is normal.  Lipase is negative and therefore doubt pancreatitis.  Pregnancy test is negative therefore doubt PID or ectopic.  UA is without evidence of UTI.  No hematuria.  CT abd/pelvis did not show any acute abnormalities.  Given that she is having persistent pain and that she had right adnexal tenderness a pelvic ultrasound was ordered to rule out reproductive pathology.  A RUQ quadrant ultrasound was also ordered to rule out an acute cholecystitis given that her RUQ pain has remained persistent despite multiple medications and her CT scan did not show any other acute abnormalities.    At shift change patient care was signed out to  [place], [place].  Plan is to follow-up on the results of her ultrasounds.  If both ultrasounds are negative, patient like safe for discharge home with symptomatic treatment with Carafate, Protonix, and zofran.  She will also be given a prescription for Flagyl to treat bacterial vaginosis.  She will be given gastroenterology follow-up.  ED Discharge Orders        Ordered    ondansetron (ZOFRAN) 4 MG tablet  Every 8 hours PRN     11/24/17 0003    sucralfate (CARAFATE) 1 g tablet  3 times daily with meals     11/23/17 2359    pantoprazole (PROTONIX) 20 MG tablet  Daily     11/23/17 2359    metroNIDAZOLE (FLAGYL) 500 MG tablet  2 times daily     11/23/17 2359       Bora Bost S, PA-C 11/24/17 0004    Deno Etienne, DO 11/24/17 0009    Eloise Picone, [place], PA-C 11/24/17 [place], [place], DO 11/26/17 1583

## 2017-11-23 NOTE — ED Triage Notes (Signed)
Patient c/o abdominal pain that radiates into the mid back x 10 days. Patient  States she went to UC a week ago and was told that she was constipated. Patient states after she had a laxative she has not had a problem with constipation,but continues to have upper abdominal pain. Patient states she has N/V every time she tries to eat.

## 2017-11-24 MED ORDER — ONDANSETRON HCL 4 MG PO TABS: 4.0000 mg | ORAL_TABLET | Freq: Three times a day (TID) | ORAL | 0 refills | Status: AC | PRN

## 2017-11-24 NOTE — ED Provider Notes (Signed)
Received signout at the beginning of shift.  Patient here for abdominal pain ongoing for the past 10 days.  Patient initially was seen 7 days ago for her symptoms.  She was told that she had signs of constipation and was given a laxative.  She report no improvement of her symptoms.  Patient had a fairly thorough work-up today for abdominal pain.  A pelvic examination performed by my partner who noted that she has right adnexal tenderness.  Her initial abdomen pelvis CT scan unremarkable.  Wet prep shows evidence of clue cells and moderate WBC.  Patient prescribed Flagyl to treat for potential BV.  Her pregnancy test is negative, she has evidence of mild dehydration with a creatinine of 1.38, IV fluid was given.  Right upper quadrant ultrasound without evidence of gallbladder etiology.  Pelvic and transvaginal ultrasound obtained show no evidence of ovarian torsion.  There is a 2 cm hypoechoic mass in the right ovary that was not thoroughly examined.  This may represent a small solid mass versus complex cyst such as endometrioma.  Consider further evaluation with nonemergent pelvic MRI.  12:39 AM Patient agrees to follow-up with her primary care provider for outpatient nonemergent pelvic MRI.  Patient otherwise without symptoms to truly suggest BV has been no vaginal discharge or foul odor.  Patient may or may not take Flagyl but it was prescribed by my partner.  At this time patient is stable for discharge.  Return precautions discussed.  BP 106/79 (BP Location: Left Arm)   Pulse 67   Temp 98.2 F (36.8 C) (Oral)   Resp 18   Ht 4\' 11"  (1.499 m)   Wt 74.4 kg (164 lb)   LMP 11/03/2017 (Approximate)   SpO2 98%   BMI 33.12 kg/m   Results for orders placed or performed during the hospital encounter of 11/23/17  Wet prep, genital  Result Value Ref Range   Yeast Wet Prep HPF POC NONE SEEN NONE SEEN   Trich, Wet Prep NONE SEEN NONE SEEN   Clue Cells Wet Prep HPF POC PRESENT (A) NONE SEEN   WBC, Wet  Prep HPF POC MODERATE (A) NONE SEEN   Sperm NONE SEEN   Lipase, blood  Result Value Ref Range   Lipase 28 11 - 51 U/L  Comprehensive metabolic panel  Result Value Ref Range   Sodium 143 135 - 145 mmol/L   Potassium 3.7 3.5 - 5.1 mmol/L   Chloride 106 98 - 111 mmol/L   CO2 28 22 - 32 mmol/L   Glucose, Bld 98 70 - 99 mg/dL   BUN 12 6 - 20 mg/dL   Creatinine, Ser 4.09 (H) 0.44 - 1.00 mg/dL   Calcium 9.7 8.9 - 81.1 mg/dL   Total Protein 7.6 6.5 - 8.1 g/dL   Albumin 3.9 3.5 - 5.0 g/dL   AST 17 15 - 41 U/L   ALT 24 0 - 44 U/L   Alkaline Phosphatase 63 38 - 126 U/L   Total Bilirubin 0.6 0.3 - 1.2 mg/dL   GFR calc non Af Amer 52 (L) >60 mL/min   GFR calc Af Amer >60 >60 mL/min   Anion gap 9 5 - 15  CBC  Result Value Ref Range   WBC 11.4 (H) 4.0 - 10.5 K/uL   RBC 4.62 3.87 - 5.11 MIL/uL   Hemoglobin 13.3 12.0 - 15.0 g/dL   HCT 91.4 78.2 - 95.6 %   MCV 88.7 78.0 - 100.0 fL   MCH 28.8 26.0 -  34.0 pg   MCHC 32.4 30.0 - 36.0 g/dL   RDW 16.1 09.6 - 04.5 %   Platelets 339 150 - 400 K/uL  Urinalysis, Routine w reflex microscopic  Result Value Ref Range   Color, Urine STRAW (A) YELLOW   APPearance CLEAR CLEAR   Specific Gravity, Urine 1.006 1.005 - 1.030   pH 5.0 5.0 - 8.0   Glucose, UA NEGATIVE NEGATIVE mg/dL   Hgb urine dipstick NEGATIVE NEGATIVE   Bilirubin Urine NEGATIVE NEGATIVE   Ketones, ur NEGATIVE NEGATIVE mg/dL   Protein, ur NEGATIVE NEGATIVE mg/dL   Nitrite NEGATIVE NEGATIVE   Leukocytes, UA NEGATIVE NEGATIVE  I-Stat beta hCG blood, ED  Result Value Ref Range   I-stat hCG, quantitative <5.0 <5 mIU/mL   Comment 3           US Transvaginal Non-ob  Result Date: 11/24/2017 CLINICAL DATA:  Pelvic pain for 10 days EXAM: TRANSABDOMINAL AND TRANSVAGINAL ULTRASOUND OF PELVIS DOPPLER ULTRASOUND OF OVARIES TECHNIQUE: Both transabdominal and transvaginal ultrasound examinations of the pelvis were performed. Transabdominal technique was performed for global imaging of the pelvis  including uterus, ovaries, adnexal regions, and pelvic cul-de-sac. It was necessary to proceed with endovaginal exam following the transabdominal exam to visualize the endometrium and ovaries. Color and duplex Doppler ultrasound was utilized to evaluate blood flow to the ovaries. COMPARISON:  None. FINDINGS: Uterus Measurements: 11.5 x 4.2 x 6.1 cm. No fibroids or other mass visualized. Endometrium Thickness: 13.3 mm.  No focal abnormality visualized. Right ovary Measurements: 3.9 x 3.4 x 3.3 cm. Hypoechoic mass in the right ovary measuring 2 x 2 x 1.9 cm. Left ovary Measurements: 4.3 x 2.1 x 3.2 cm. Normal appearance/no adnexal mass. Pulsed Doppler evaluation of both ovaries demonstrates normal low-resistance arterial and venous waveforms. Other findings No abnormal free fluid. IMPRESSION: 1. Negative for ovarian torsion 2. 2 cm hypoechoic mass in the right ovary. This could not be evaluated transvaginally due to pain. This may represent a small solid mass versus complex cyst such as endometrioma. Consider further evaluation with nonemergent pelvic MRI. Electronically Signed   By: Jasmine Pang M.D.   On: 11/24/2017 00:16   US Pelvis Complete  Result Date: 11/24/2017 CLINICAL DATA:  Pelvic pain for 10 days EXAM: TRANSABDOMINAL AND TRANSVAGINAL ULTRASOUND OF PELVIS DOPPLER ULTRASOUND OF OVARIES TECHNIQUE: Both transabdominal and transvaginal ultrasound examinations of the pelvis were performed. Transabdominal technique was performed for global imaging of the pelvis including uterus, ovaries, adnexal regions, and pelvic cul-de-sac. It was necessary to proceed with endovaginal exam following the transabdominal exam to visualize the endometrium and ovaries. Color and duplex Doppler ultrasound was utilized to evaluate blood flow to the ovaries. COMPARISON:  None. FINDINGS: Uterus Measurements: 11.5 x 4.2 x 6.1 cm. No fibroids or other mass visualized. Endometrium Thickness: 13.3 mm.  No focal abnormality visualized.  Right ovary Measurements: 3.9 x 3.4 x 3.3 cm. Hypoechoic mass in the right ovary measuring 2 x 2 x 1.9 cm. Left ovary Measurements: 4.3 x 2.1 x 3.2 cm. Normal appearance/no adnexal mass. Pulsed Doppler evaluation of both ovaries demonstrates normal low-resistance arterial and venous waveforms. Other findings No abnormal free fluid. IMPRESSION: 1. Negative for ovarian torsion 2. 2 cm hypoechoic mass in the right ovary. This could not be evaluated transvaginally due to pain. This may represent a small solid mass versus complex cyst such as endometrioma. Consider further evaluation with nonemergent pelvic MRI. Electronically Signed   By: Jasmine Pang M.D.   On: 11/24/2017  00:16   Ct Abdomen Pelvis W Contrast  Result Date: 11/23/2017 CLINICAL DATA:  Nausea, vomiting and abdominal pain EXAM: CT ABDOMEN AND PELVIS WITH CONTRAST TECHNIQUE: Multidetector CT imaging of the abdomen and pelvis was performed using the standard protocol following bolus administration of intravenous contrast. CONTRAST:  100mL ISOVUE-300 IOPAMIDOL (ISOVUE-300) INJECTION 61% COMPARISON:  CT abdomen and pelvis 07/29/2016 FINDINGS: LOWER CHEST: No basilar pulmonary nodules or pleural effusion. No apical pericardial effusion. HEPATOBILIARY: Normal hepatic contours and density. No intra- or extrahepatic biliary dilatation. Normal gallbladder. PANCREAS: Normal parenchymal contours without ductal dilatation. No peripancreatic fluid collection. SPLEEN: Normal. ADRENALS/URINARY TRACT: --Adrenal glands: Normal. --Right kidney/ureter: No hydronephrosis, nephroureterolithiasis, perinephric stranding or solid renal mass. --Left kidney/ureter: No hydronephrosis, nephroureterolithiasis, perinephric stranding or solid renal mass. --Urinary bladder: Normal for degree of distention STOMACH/BOWEL: --Stomach/Duodenum: No hiatal hernia or other gastric abnormality. Normal duodenal course. --Small bowel: No dilatation or inflammation. --Colon: No focal abnormality.  --Appendix: Surgically absent. VASCULAR/LYMPHATIC: Normal course and caliber of the major abdominal vessels. No abdominal or pelvic lymphadenopathy. REPRODUCTIVE: No free fluid in the pelvis. MUSCULOSKELETAL. No bony spinal canal stenosis or focal osseous abnormality. OTHER: None. IMPRESSION: No acute abnormality of the abdomen or pelvis. Electronically Signed   By: Deatra RobinsonKevin  Herman M.D.   On: 11/23/2017 20:09   [place] Art/ven Flow Abd Pelv Doppler  Result Date: 11/24/2017 CLINICAL DATA:  Pelvic pain for 10 days EXAM: TRANSABDOMINAL AND TRANSVAGINAL ULTRASOUND OF PELVIS DOPPLER ULTRASOUND OF OVARIES TECHNIQUE: Both transabdominal and transvaginal ultrasound examinations of the pelvis were performed. Transabdominal technique was performed for global imaging of the pelvis including uterus, ovaries, adnexal regions, and pelvic cul-de-sac. It was necessary to proceed with endovaginal exam following the transabdominal exam to visualize the endometrium and ovaries. Color and duplex Doppler ultrasound was utilized to evaluate blood flow to the ovaries. COMPARISON:  None. FINDINGS: Uterus Measurements: 11.5 x 4.2 x 6.1 cm. No fibroids or other mass visualized. Endometrium Thickness: 13.3 mm.  No focal abnormality visualized. Right ovary Measurements: 3.9 x 3.4 x 3.3 cm. Hypoechoic mass in the right ovary measuring 2 x 2 x 1.9 cm. Left ovary Measurements: 4.3 x 2.1 x 3.2 cm. Normal appearance/no adnexal mass. Pulsed Doppler evaluation of both ovaries demonstrates normal low-resistance arterial and venous waveforms. Other findings No abnormal free fluid. IMPRESSION: 1. Negative for ovarian torsion 2. 2 cm hypoechoic mass in the right ovary. This could not be evaluated transvaginally due to pain. This may represent a small solid mass versus complex cyst such as endometrioma. Consider further evaluation with nonemergent pelvic MRI. Electronically Signed   By: Jasmine PangKim  Fujinaga M.D.   On: 11/24/2017 00:16   [place] Abdomen Limited  Ruq  Result Date: 11/24/2017 CLINICAL DATA:  Right upper quadrant pain for 10 days. EXAM: ULTRASOUND ABDOMEN LIMITED RIGHT UPPER QUADRANT COMPARISON:  CT abdomen and pelvis 11/23/2017 FINDINGS: Gallbladder: No gallstones or wall thickening visualized. No sonographic Murphy sign noted by sonographer. Common bile duct: Diameter: 2.5 mm, normal Liver: No focal lesion identified. Within normal limits in parenchymal echogenicity. Portal vein is patent on color Doppler imaging with normal direction of blood flow towards the liver. IMPRESSION: Normal examination. No evidence of cholelithiasis or acute cholecystitis. Electronically Signed   By: Burman NievesWilliam  Stevens M.D.   On: 11/24/2017 00:04      Fayrene Helperran, Lilu Mcglown, PA-C 11/24/17 0040    Palumbo, April, MD 11/24/17 [id number]

## 2017-11-25 LAB — GC/CHLAMYDIA PROBE AMP (~~LOC~~) NOT AT ARMC
CHLAMYDIA, DNA PROBE: NEGATIVE
NEISSERIA GONORRHEA: NEGATIVE

## 2018-10-03 IMAGING — CT CT ABD-PELV W/ CM
2 of 4 series · 16 of 46 positions shown, 18 images · IV contrast (ISOVUE)
Comparison: CT abdomen and pelvis 07/29/2016

CLINICAL DATA: Nausea, vomiting and abdominal pain

EXAM:
CT ABDOMEN AND PELVIS WITH CONTRAST
TECHNIQUE: Multidetector CT imaging of the abdomen and pelvis was performed
using the standard protocol following bolus administration of
intravenous contrast.
CONTRAST:  100mL [id number] IOPAMIDOL ([id number]) INJECTION 61%

[Series 2: axial st · axial · 0.90mm/px · z∈[-459,-59]mm · 13 of 91 slices shown, 15 images]
[im 6/91  soft-tissue]
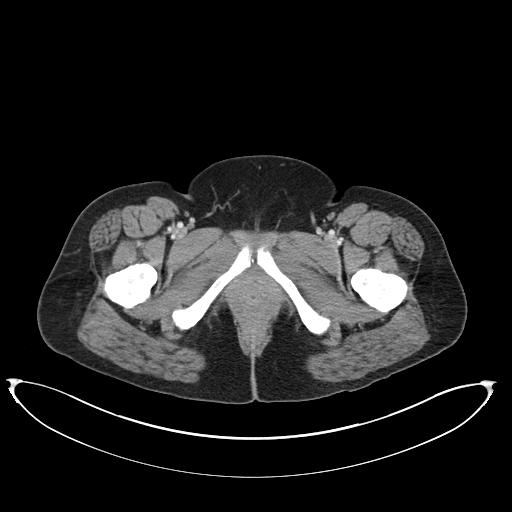
[im 6/91  bone]
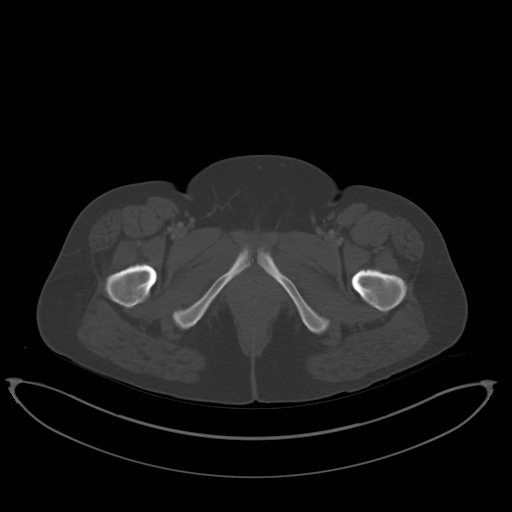
[im 11/91  soft-tissue]
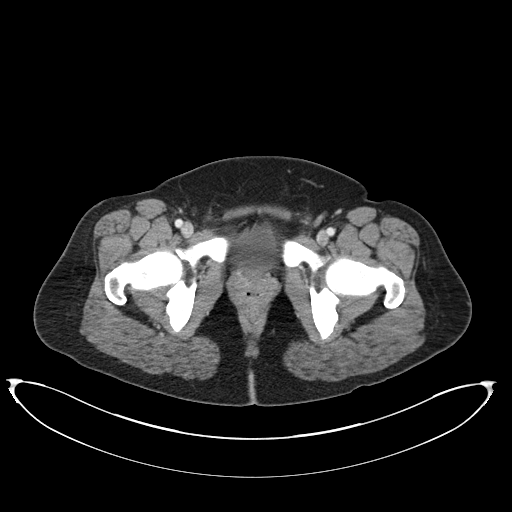
[im 21/91  soft-tissue]
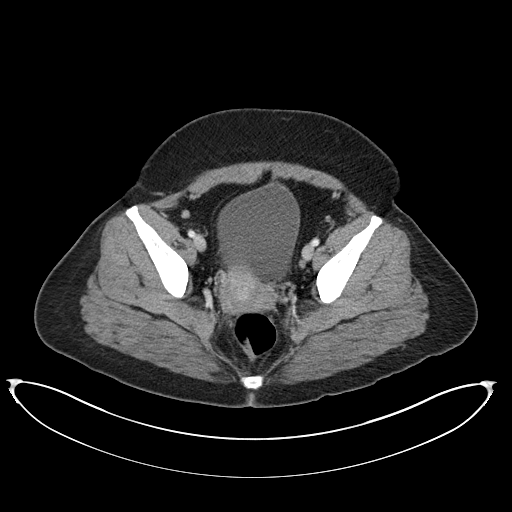
[im 26/91  soft-tissue]
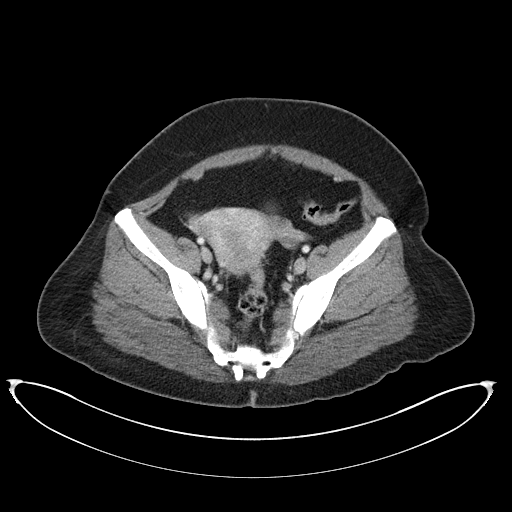
[im 31/91  soft-tissue]
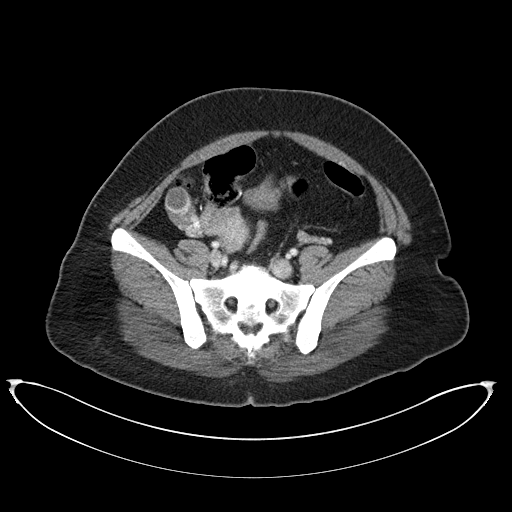
[im 41/91  soft-tissue]
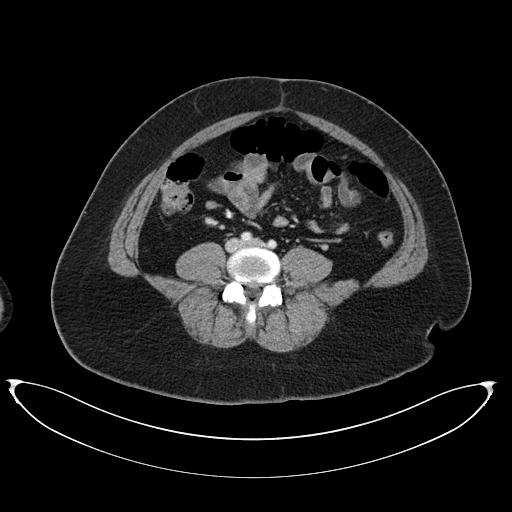
[im 46/91  soft-tissue]
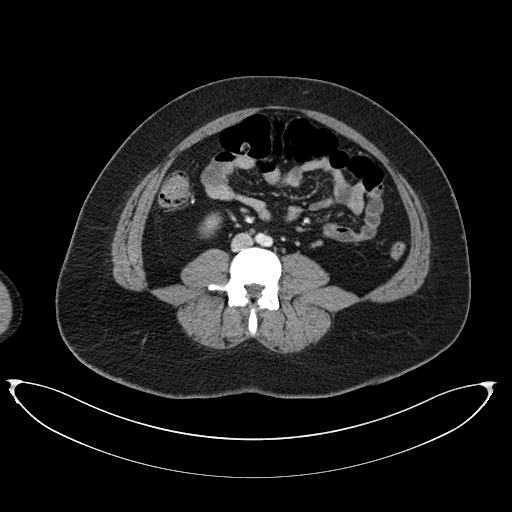
[im 51/91  soft-tissue]
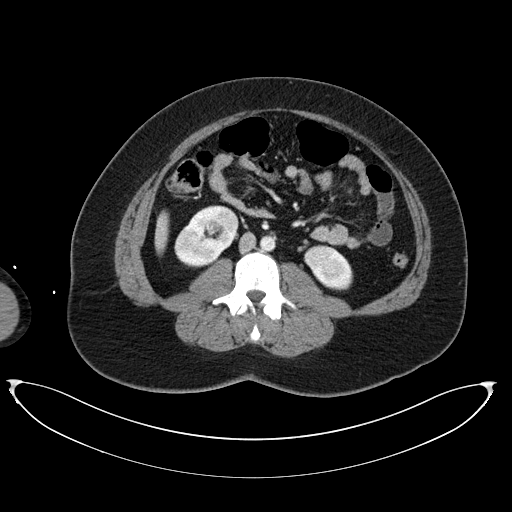
[im 61/91  soft-tissue]
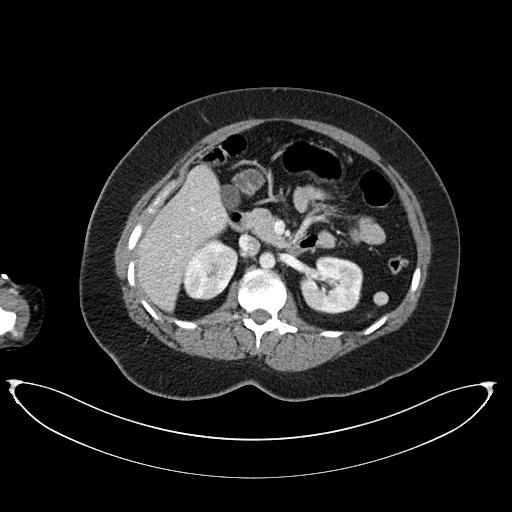
[im 61/91  bone]
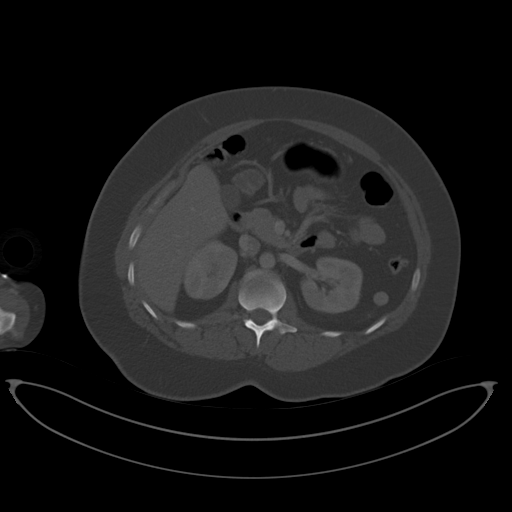
[im 66/91  soft-tissue]
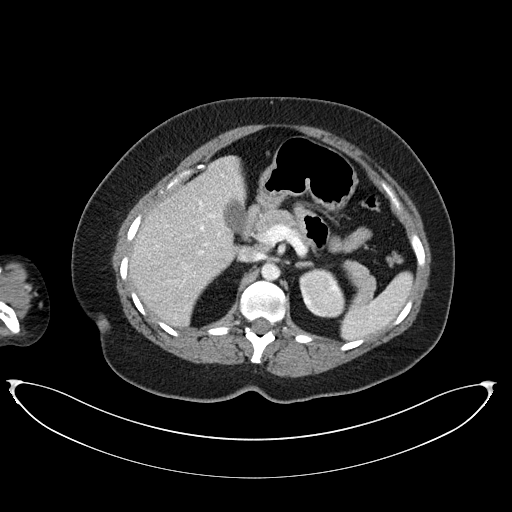
[im 71/91  soft-tissue]
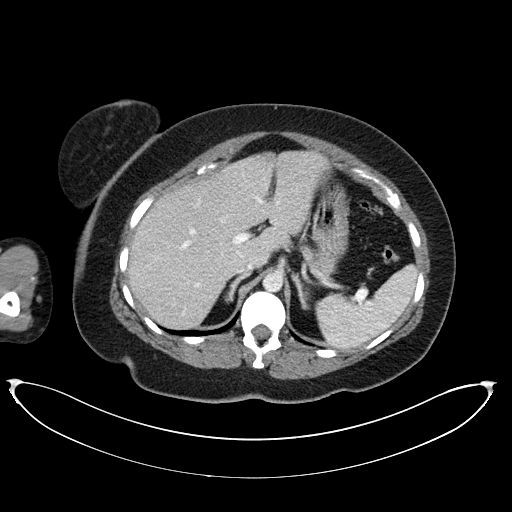
[im 81/91  soft-tissue]
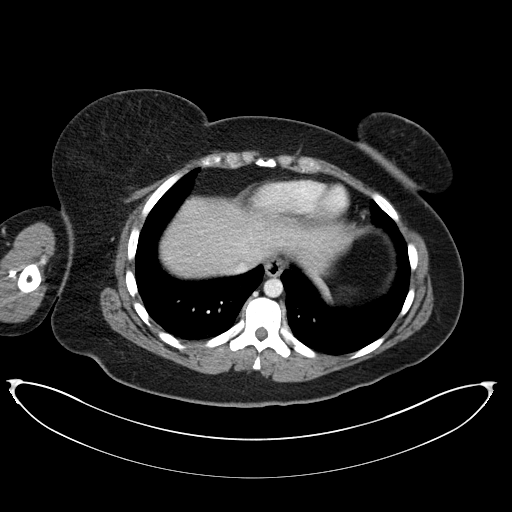
[im 86/91  soft-tissue]
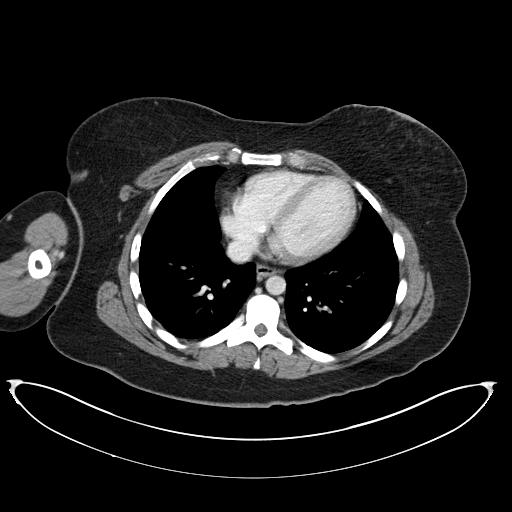

[Series 5: coronal st · coronal · 0.73mm/px · 3 of 103 slices shown]
[im 35/103  soft-tissue]
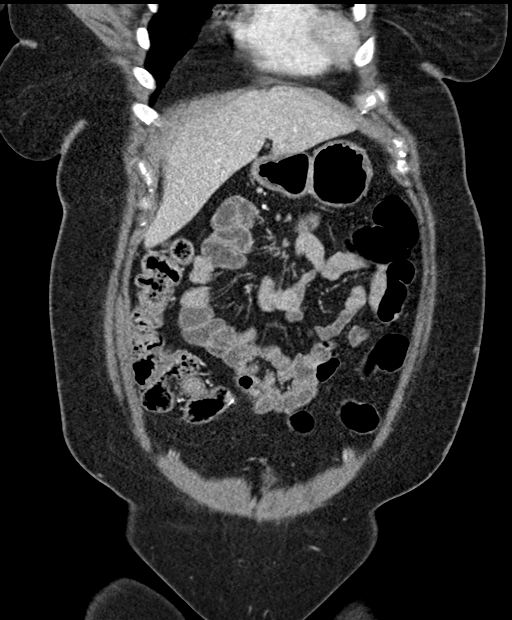
[im 46/103  soft-tissue]
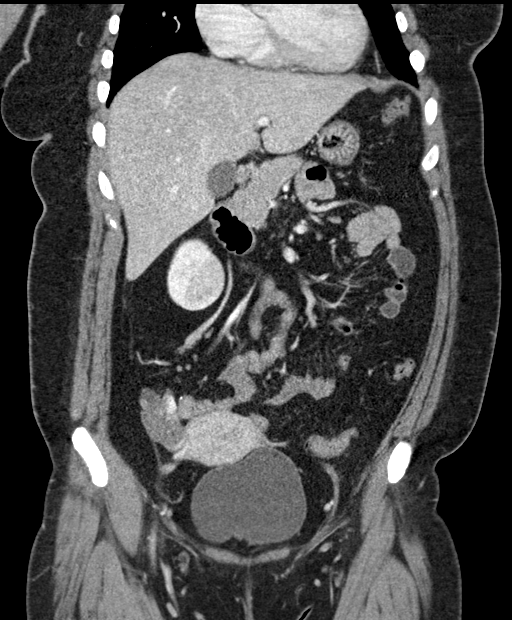
[im 57/103  soft-tissue]
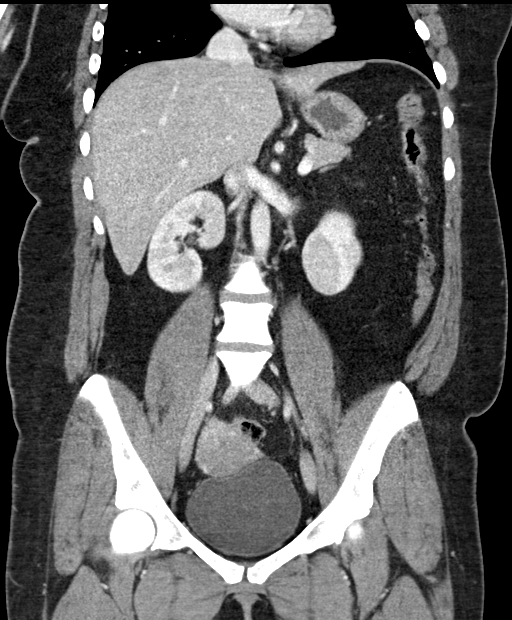

[16 of 46 positions shown; findings below may reference images not displayed]

FINDINGS: LOWER CHEST: No basilar pulmonary nodules or pleural effusion. No
apical pericardial effusion.

HEPATOBILIARY: Normal hepatic contours and density. No intra- or
extrahepatic biliary dilatation. Normal gallbladder.

PANCREAS: Normal parenchymal contours without ductal dilatation. No
peripancreatic fluid collection.

SPLEEN: Normal.

ADRENALS/URINARY TRACT:

--Adrenal glands: Normal.

--Right kidney/ureter: No hydronephrosis, nephroureterolithiasis,
perinephric stranding or solid renal mass.

--Left kidney/ureter: No hydronephrosis, nephroureterolithiasis,
perinephric stranding or solid renal mass.

--Urinary bladder: Normal for degree of distention

STOMACH/BOWEL:

--Stomach/Duodenum: No hiatal hernia or other gastric abnormality.
Normal duodenal course.

--Small bowel: No dilatation or inflammation.

--Colon: No focal abnormality.

--Appendix: Surgically absent.

VASCULAR/LYMPHATIC: Normal course and caliber of the major abdominal
vessels. No abdominal or pelvic lymphadenopathy.

REPRODUCTIVE: No free fluid in the pelvis.

MUSCULOSKELETAL. No bony spinal canal stenosis or focal osseous
abnormality.

OTHER: None.
IMPRESSION: No acute abnormality of the abdomen or pelvis.

## 2018-10-03 IMAGING — US US ABDOMEN LIMITED
1 series · 14 of 25 positions shown · non-contrast
Comparison: CT abdomen and pelvis 11/23/2017

CLINICAL DATA: Right upper quadrant pain for 10 days.

EXAM:
ULTRASOUND ABDOMEN LIMITED RIGHT UPPER QUADRANT

[Series 1: us abdomen limited · 14 of 42 slices shown]
[im 1/42]
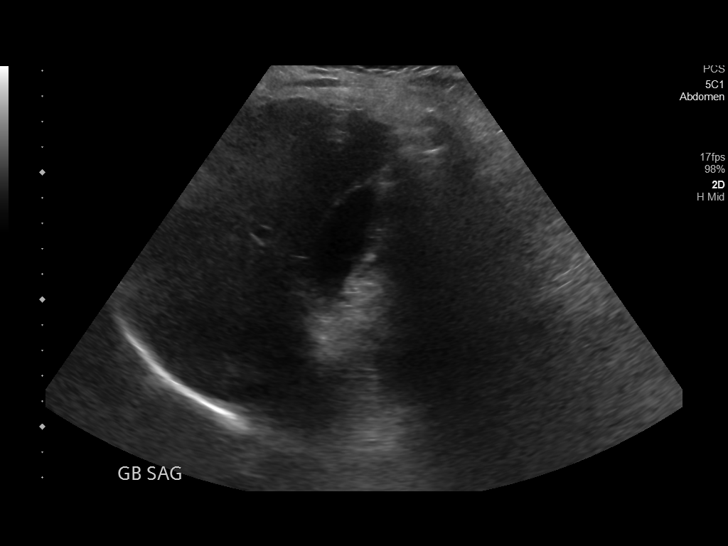
[im 4/42]
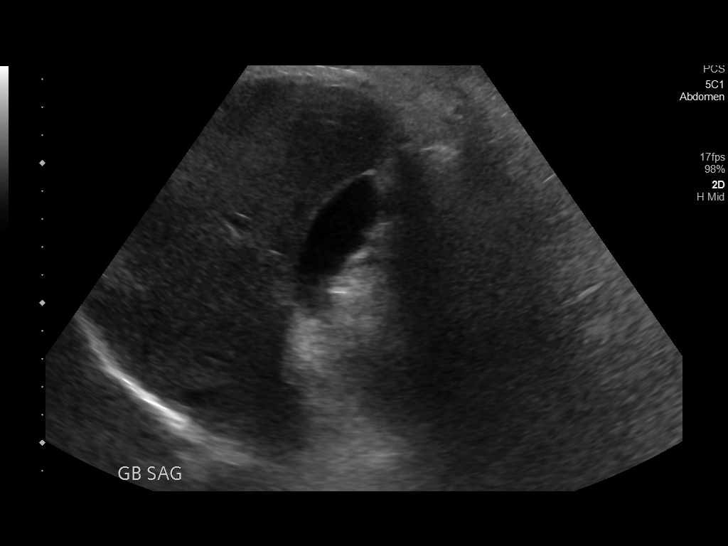
[im 7/42]
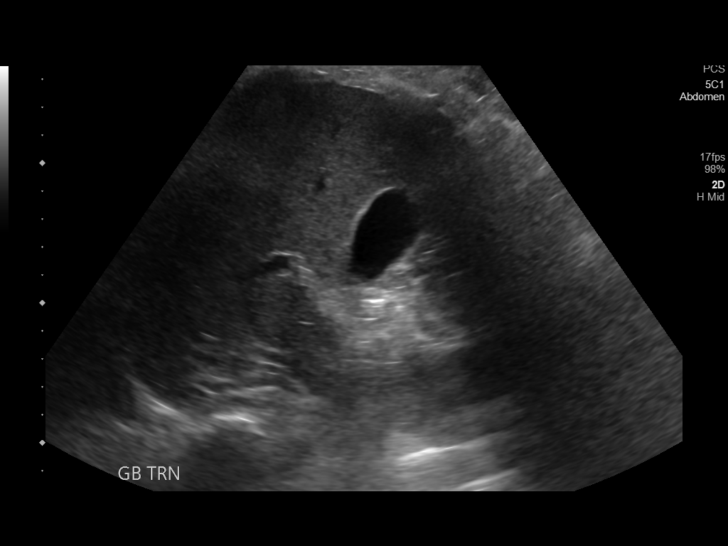
[im 11/42]
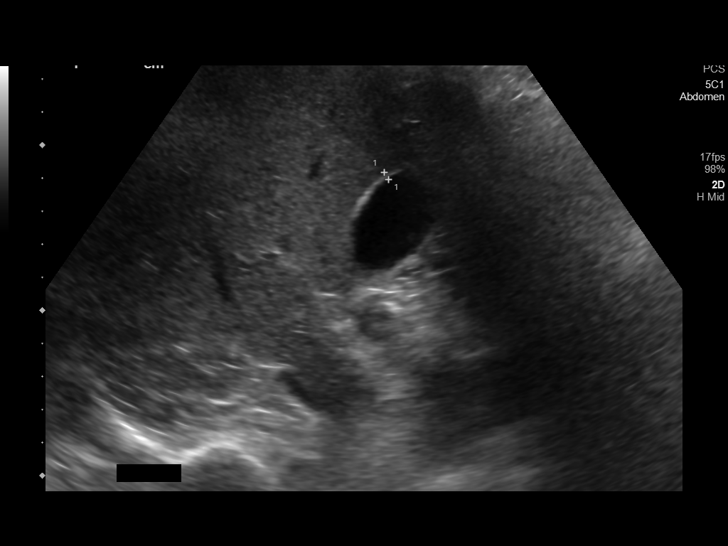
[im 14/42]
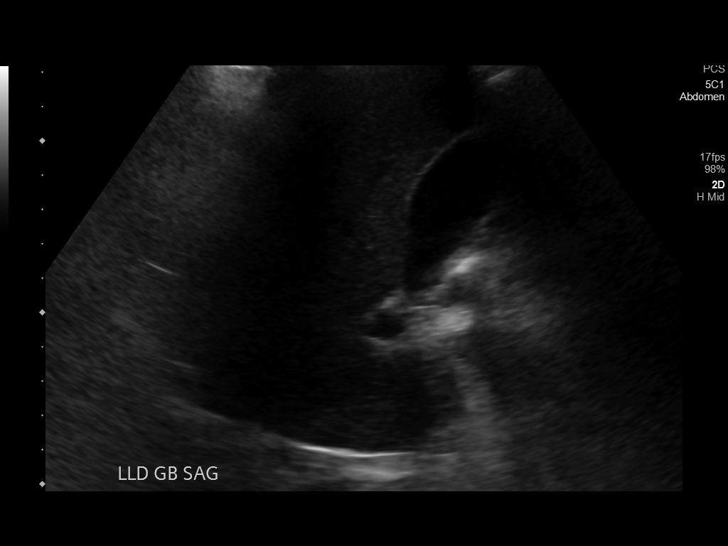
[im 16/42]
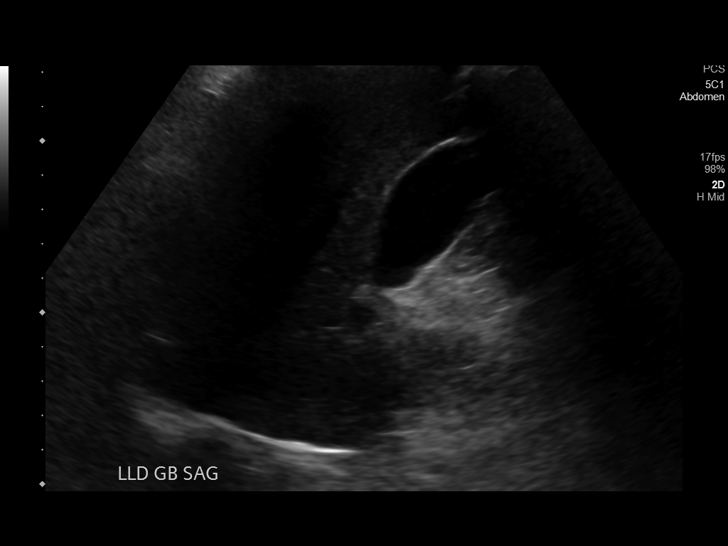
[im 19/42]
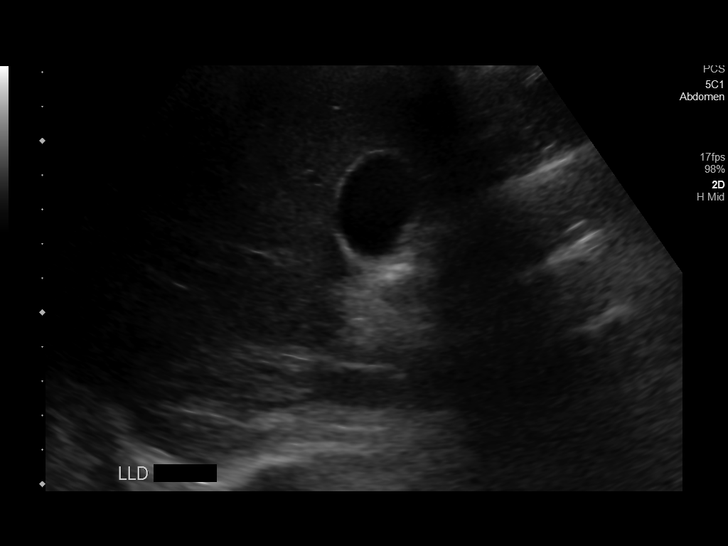
[im 23/42]
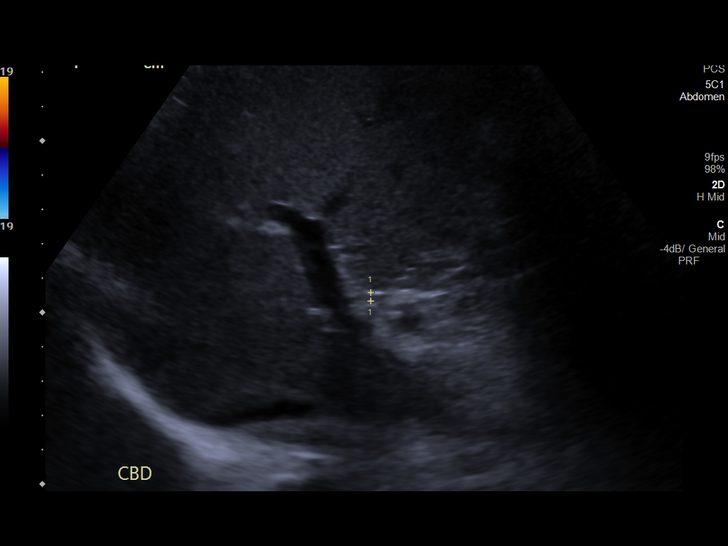
[im 26/42]
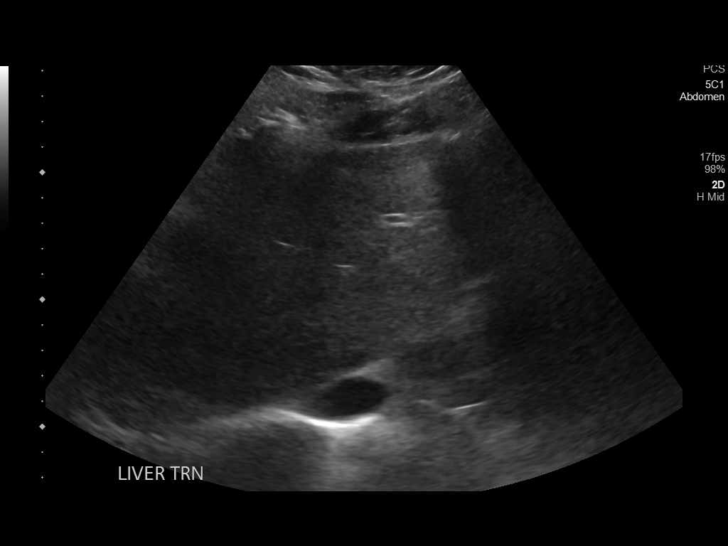
[im 28/42]
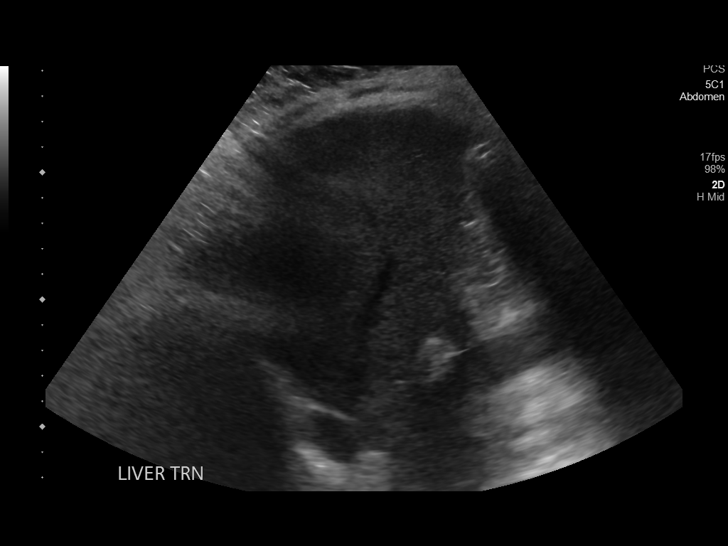
[im 31/42]
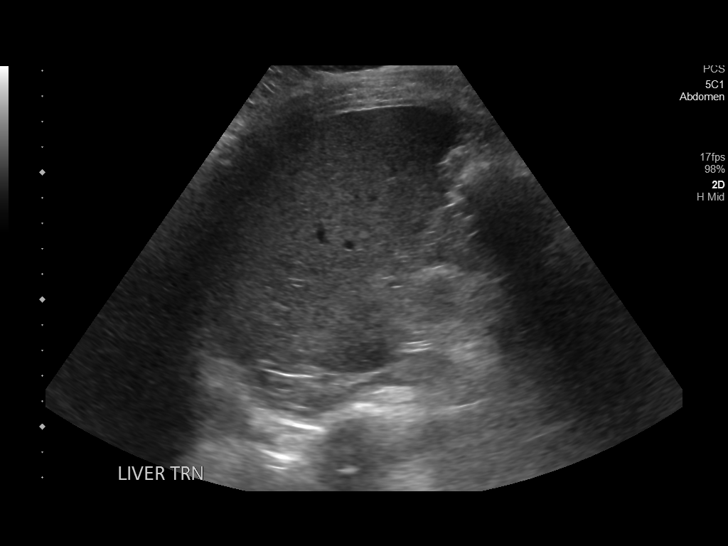
[im 35/42]
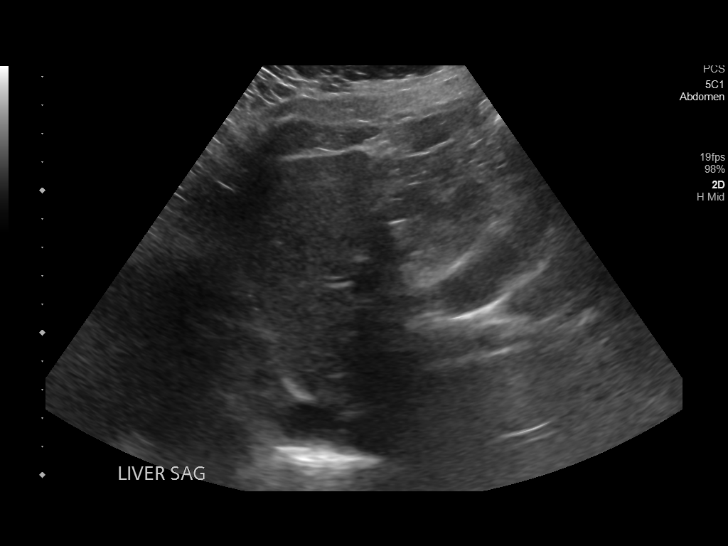
[im 38/42]
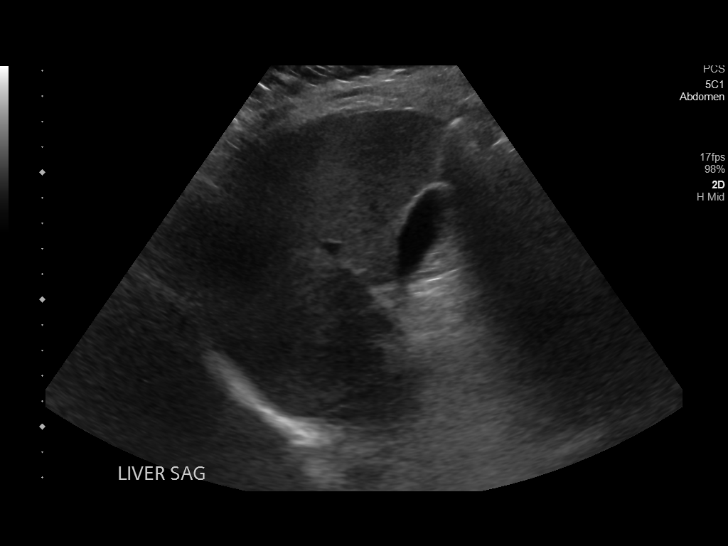
[im 42/42]
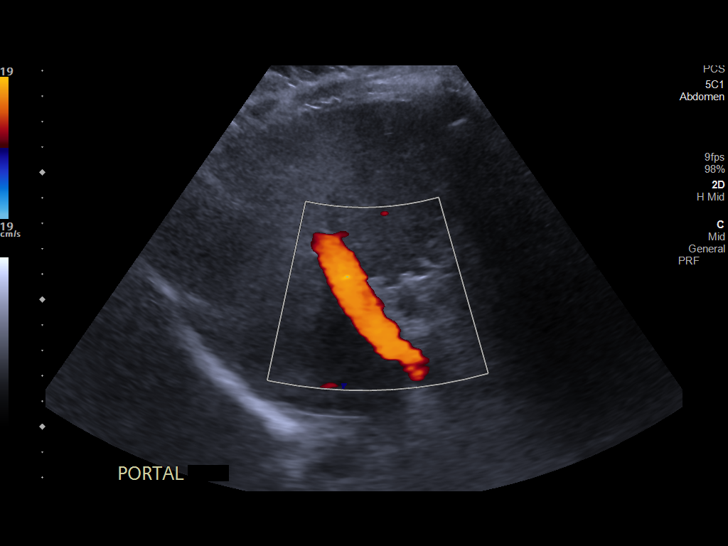

[14 of 25 positions shown; findings below may reference images not displayed]

FINDINGS: Gallbladder:

No gallstones or wall thickening visualized. No sonographic Murphy
sign noted by sonographer.

Common bile duct:

Diameter: 2.5 mm, normal

Liver:

No focal lesion identified. Within normal limits in parenchymal
echogenicity. Portal vein is patent on color Doppler imaging with
normal direction of blood flow towards the liver.
IMPRESSION: Normal examination. No evidence of cholelithiasis or acute
cholecystitis.
